# Patient Record
Sex: Female | Born: 1985 | Race: White | Hispanic: No | Marital: Married | Smoking: Never smoker
Health system: Southern US, Community
[De-identification: ages and names within clinical notes are randomized; demographics above are authoritative.]

## PROBLEM LIST (undated history)

## (undated) DIAGNOSIS — R110 Nausea: Secondary | ICD-10-CM

## (undated) DIAGNOSIS — R197 Diarrhea, unspecified: Secondary | ICD-10-CM

## (undated) DIAGNOSIS — M1990 Unspecified osteoarthritis, unspecified site: Secondary | ICD-10-CM

## (undated) DIAGNOSIS — Z973 Presence of spectacles and contact lenses: Secondary | ICD-10-CM

## (undated) DIAGNOSIS — R5383 Other fatigue: Secondary | ICD-10-CM

## (undated) DIAGNOSIS — G43909 Migraine, unspecified, not intractable, without status migrainosus: Secondary | ICD-10-CM

## (undated) DIAGNOSIS — K8020 Calculus of gallbladder without cholecystitis without obstruction: Secondary | ICD-10-CM

## (undated) DIAGNOSIS — K219 Gastro-esophageal reflux disease without esophagitis: Secondary | ICD-10-CM

## (undated) DIAGNOSIS — R634 Abnormal weight loss: Secondary | ICD-10-CM

## (undated) DIAGNOSIS — R630 Anorexia: Secondary | ICD-10-CM

## (undated) DIAGNOSIS — B019 Varicella without complication: Secondary | ICD-10-CM

## (undated) DIAGNOSIS — J45909 Unspecified asthma, uncomplicated: Secondary | ICD-10-CM

## (undated) HISTORY — DX: Unspecified asthma, uncomplicated: J45.909

## (undated) HISTORY — DX: Varicella without complication: B01.9

## (undated) HISTORY — DX: Diarrhea, unspecified: R19.7

## (undated) HISTORY — DX: Migraine, unspecified, not intractable, without status migrainosus: G43.909

## (undated) HISTORY — DX: Abnormal weight loss: R63.4

## (undated) HISTORY — DX: Gastro-esophageal reflux disease without esophagitis: K21.9

## (undated) HISTORY — DX: Nausea: R11.0

## (undated) HISTORY — DX: Other fatigue: R53.83

## (undated) HISTORY — DX: Unspecified osteoarthritis, unspecified site: M19.90

## (undated) HISTORY — DX: Presence of spectacles and contact lenses: Z97.3

## (undated) HISTORY — PX: WISDOM TOOTH EXTRACTION: SHX21

## (undated) HISTORY — DX: Anorexia: R63.0

## (undated) HISTORY — PX: CHOLECYSTECTOMY: SHX55

---
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
## 2005-05-05 ENCOUNTER — Emergency Department (HOSPITAL_COMMUNITY): Admission: EM | Admit: 2005-05-05 | Discharge: 2005-05-05 | Payer: Self-pay | Admitting: Emergency Medicine

## 2011-06-23 ENCOUNTER — Encounter (INDEPENDENT_AMBULATORY_CARE_PROVIDER_SITE_OTHER): Payer: Self-pay

## 2011-06-24 ENCOUNTER — Encounter (INDEPENDENT_AMBULATORY_CARE_PROVIDER_SITE_OTHER): Payer: Self-pay | Admitting: General Surgery

## 2011-06-24 ENCOUNTER — Ambulatory Visit (INDEPENDENT_AMBULATORY_CARE_PROVIDER_SITE_OTHER): Payer: BC Managed Care – PPO | Admitting: General Surgery

## 2011-06-24 VITALS — BP 110/74 | HR 83 | Temp 98.5°F | Ht 66.0 in | Wt 139.6 lb

## 2011-06-24 DIAGNOSIS — K8020 Calculus of gallbladder without cholecystitis without obstruction: Secondary | ICD-10-CM

## 2011-06-24 HISTORY — DX: Calculus of gallbladder without cholecystitis without obstruction: K80.20

## 2011-06-24 NOTE — Progress Notes (Signed)
Chief Complaint  Patient presents with  . Other    eval gallstones    HPI [name] is a 25 y.o. female.   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 More recently within the past month the patient does have more severe right upper quadrant pain associated with nausea no vomiting no fevers no chills and no jaundice.  She has been very careful about what she eats however regardless of what she does eat she tends to have the pain after eating. The pain usually comes at nighttime.  Past Medical History  Diagnosis Date  . Abdominal pain   . Diarrhea   . Weight loss   . Nausea   . Weight loss   . Fatigue   . Nausea   . Poor appetite   . Abdominal pain   . Wears glasses     History reviewed. No pertinent past surgical history.  Family History  Problem Relation Age of Onset  . Cervical cancer Mother   . Multiple sclerosis Mother     Social History History  Substance Use Topics  . Smoking status: Never Smoker   . Smokeless tobacco: Not on file  . Alcohol Use: Yes     social    No Known Allergies  Current Outpatient Prescriptions  Medication Sig Dispense Refill  . Norgestimate-Ethinyl Estradiol Triphasic (ORTHO TRI-CYCLEN LO) 0.18/0.215/0.25 MG-25 MCG tablet Take 1 tablet by mouth daily.        . traMADol (ULTRAM) 50 MG tablet Take 50 mg by mouth as needed.          Review of Systems Review of Systems  Constitutional: Positive for weight loss. Negative for fever and chills.  Eyes: Negative.   Respiratory: Negative.   Cardiovascular: Negative.   Gastrointestinal: Positive for heartburn (has taken protonix in past), nausea, abdominal pain and diarrhea. Negative for constipation and blood in stool.  Neurological: Negative.   Endo/Heme/Allergies: Negative.   Psychiatric/Behavioral: Negative.     Blood pressure 110/74, pulse  83, temperature 98.5 F (36.9 C), height 5\' 6"  (1.676 m), weight 139 lb 9.6 oz (63.322 kg).  Physical Exam Physical Exam  Constitutional: She is oriented to person, place, and time. She appears well-developed and well-nourished.  HENT:  Head: Normocephalic and atraumatic.  Eyes: EOM are normal. Pupils are equal, round, and reactive to light.  Neck: Normal range of motion. Neck supple.  Cardiovascular: Normal rate, regular rhythm and normal heart sounds.  Exam reveals no gallop.   No murmur heard. Respiratory: Effort normal and breath sounds normal.  GI: Soft. She exhibits no mass. There is tenderness (RUG tenderness>epigastric tenderness). There is no rebound and no guarding.  Musculoskeletal: Normal range of motion.  Neurological: She is alert and oriented to person, place, and time.  Skin: Skin is warm and dry.  Psychiatric: She has a normal mood and affect. Her behavior is normal. Judgment and thought content normal.     Data Reviewed US shows gallstones without evidence of cholecystitis.  Normal LFTs Assessment    Symptomatic cholelithiasis .    Plan    The risk and benefits of surgical treatment have been explained to the patient along with her the options. The patient and her mom understand and wish to proceed with surgery soon as possible. We will schedule her for laparoscopic cholecystectomy with possible cholangiogram and soon as possible.  Edmond Ginsberg III,Lizandra Zakrzewski O 06/24/2011, 9:49 AM

## 2011-06-26 ENCOUNTER — Encounter (HOSPITAL_COMMUNITY)
Admission: RE | Admit: 2011-06-26 | Discharge: 2011-06-26 | Disposition: A | Discharge status: Home / Self Care | Payer: BC Managed Care – PPO | Source: Ambulatory Visit | Attending: General Surgery | Admitting: General Surgery

## 2011-06-26 LAB — COMPREHENSIVE METABOLIC PANEL
AST: 17 U/L (ref 0–37)
Albumin: 3.8 g/dL (ref 3.5–5.2)
BUN: 11 mg/dL (ref 6–23)
CO2: 26 mEq/L (ref 19–32)
Calcium: 10.1 mg/dL (ref 8.4–10.5)
Creatinine, Ser: 0.71 mg/dL (ref 0.50–1.10)
GFR calc non Af Amer: >60 mL/min (ref >60)

## 2011-06-26 LAB — DIFFERENTIAL
Eosinophils Absolute: 0.1 10*3/uL (ref 0.0–0.7)
Eosinophils Relative: 1 % (ref 0–5)
Lymphs Abs: 3.2 10*3/uL (ref 0.7–4.0)
Monocytes Relative: 5 % (ref 3–12)

## 2011-06-26 LAB — SURGICAL PCR SCREEN: Staphylococcus aureus: NEGATIVE

## 2011-06-26 LAB — CBC
HCT: 35.9 % — ABNORMAL LOW (ref 36.0–46.0)
MCH: 27.3 pg (ref 26.0–34.0)
MCV: 79.6 fL (ref 78.0–100.0)
Platelets: 398 10*3/uL (ref 150–400)
RDW: 12.5 % (ref 11.5–15.5)

## 2011-06-26 LAB — HCG, SERUM, QUALITATIVE: Preg, Serum: NEGATIVE

## 2011-06-28 HISTORY — PX: LAPAROSCOPIC CHOLECYSTECTOMY: SUR755

## 2011-06-30 ENCOUNTER — Ambulatory Visit (HOSPITAL_COMMUNITY)
Admission: RE | Admit: 2011-06-30 | Discharge: 2011-06-30 | Disposition: A | Discharge status: Home / Self Care | Payer: BC Managed Care – PPO | Source: Ambulatory Visit | Attending: General Surgery | Admitting: General Surgery

## 2011-06-30 ENCOUNTER — Other Ambulatory Visit (INDEPENDENT_AMBULATORY_CARE_PROVIDER_SITE_OTHER): Payer: Self-pay | Admitting: General Surgery

## 2011-06-30 DIAGNOSIS — K8010 Calculus of gallbladder with chronic cholecystitis without obstruction: Secondary | ICD-10-CM | POA: Insufficient documentation

## 2011-06-30 DIAGNOSIS — F411 Generalized anxiety disorder: Secondary | ICD-10-CM | POA: Insufficient documentation

## 2011-06-30 DIAGNOSIS — Z01812 Encounter for preprocedural laboratory examination: Secondary | ICD-10-CM | POA: Insufficient documentation

## 2011-07-14 NOTE — Op Note (Signed)
[name], [name] NO.:  [id number]  MEDICAL RECORD NO.:  [id number]  LOCATION:  [facility]                         FACILITY:  [facility]  PHYSICIAN:  [name], M.D.    DATE OF BIRTH:  12/28/1985  DATE OF PROCEDURE:  06/30/2011 DATE OF DISCHARGE:                              OPERATIVE REPORT   PREOPERATIVE DIAGNOSIS:  Symptomatic cholelithiasis.  POSTOPERATIVE DIAGNOSIS:  Symptomatic cholelithiasis.  PROCEDURE:  Laparoscopic cholecystectomy.  SURGEON:  Emily Lamas. Lindie Spruce, MD  ANESTHESIA:  General endotracheal.  ESTIMATED BLOOD LOSS:  Less than 20 mL.  No complications.  CONDITION:  Stable.  FINDINGS:  The patient had transposed cystic duct and cystic artery with the cystic artery being on the outer border of the triangle of Calot and the cystic duct on the inner part between the artery and the common bile duct.  INDICATIONS FOR OPERATION:  The patient is a 25 year old with symptomatic gallstones who now comes in for an elective laparoscopic cholecystectomy.  OPERATION:  After proper time-out was performed identifying the patient and procedure to be performed, the patient was prepped and draped in usual sterile manner exposing the entire abdomen.  An infraumbilical midline incision was made using #15 blade, taken down to the midline fascia.  We grabbed the fascia with Kocher clamps and tented up.  We incised the fascia using a 15 blade and then bluntly dissected out through the preperitoneal space into the peritoneal cavity.  Once this was done, pursestring suture of 0 Vicryl was passed around the fascial opening.  This secured in Hasson cannula which was subsequently passed.  It was through the [facility] cannula that carbon dioxide gas was insufflated up to a maximal intra-abdominal pressure of 15 mmHg.  Once that was performed, we passed 2 right upper quadrant 5-mm cannulas and a subxiphoid 5-mm cannula under direct vision.  The patient was placed in  reverse Trendelenburg, the left side was tilted down.  The gallbladder had some omental adhesions to the body, which were taken down as we retracted towards the anterior abdominal wall.  We were able to dissect out the peritoneum overlying the triangle of Calot and hepatoduodenal triangle.  As we noted there was an arterial structure, we came to the lower portion of the infundibulum simulating cystic duct. The patient had normal preoperative LFTs.  No plans for cholangiogram was being made.  We clipped this structure proximally and distally after we had a 360-degree window dissected freely.  We clipped it proximally and distally and transected it.  We then got to the duct which was on the inner part of the triangle, dissected it out proximally and distally, placed clips on it proximally x2 and then distally x1 and transected it.  We then dissected out the gallbladder from its bed with minimal difficulty without entrance into the gallbladder.  We used an EndoCatch bag to retrieve it from the infraumbilical site.  We tied off the fascia using a pursestring suture which was in place, irrigated the peritoneal cavity and the hepatic bed with saline and there was minimal to no bleeding.  Once this was done, we aspirated all fluid and gas from above the liver removed all  cannulas.  We injected 0.25% Marcaine with epi at all sites.  We used a running subcuticular 4-0 Monocryl to sew the infraumbilical skin site. Dermabond, Steri-Strips, and Tegaderm were used complete the dressing. All needle, sponge counts and instrument counts were correct.     [name], M.D.     JOW/MEDQ  D:  06/30/2011  T:  06/30/2011  Job:  [id number]  cc:   Emily Gemma, PA  Electronically Signed by [name] M.D. on 07/14/2011 08:50:57 AM

## 2011-07-22 ENCOUNTER — Encounter (INDEPENDENT_AMBULATORY_CARE_PROVIDER_SITE_OTHER): Payer: Self-pay | Admitting: General Surgery

## 2011-07-22 ENCOUNTER — Ambulatory Visit (INDEPENDENT_AMBULATORY_CARE_PROVIDER_SITE_OTHER): Payer: BC Managed Care – PPO | Admitting: General Surgery

## 2011-07-22 VITALS — BP 102/74 | HR 60 | Temp 97.2°F | Resp 14 | Ht 65.0 in | Wt 134.1 lb

## 2011-07-22 DIAGNOSIS — Z09 Encounter for follow-up examination after completed treatment for conditions other than malignant neoplasm: Secondary | ICD-10-CM

## 2011-07-22 NOTE — Progress Notes (Signed)
HPI The patient is doing well status post laparoscopic cholecystectomy for acute cholecystitis and cholelithiasis.  PE Her wounds are healing well with no evidence of infection. She has good bowel sounds. She is nontender.  Studiy review No studies to review.  Assessment Doing well status post laparoscopic cholecystectomy.  Plan Return to see me on a p.r.n. basis. She's been released to full duty.

## 2012-03-10 ENCOUNTER — Other Ambulatory Visit: Payer: Self-pay | Admitting: Obstetrics and Gynecology

## 2012-09-20 ENCOUNTER — Other Ambulatory Visit: Payer: Self-pay | Admitting: Obstetrics and Gynecology

## 2013-09-06 DIAGNOSIS — IMO0002 Reserved for concepts with insufficient information to code with codable children: Secondary | ICD-10-CM | POA: Insufficient documentation

## 2017-11-16 LAB — HM PAP SMEAR: HM Pap smear: NEGATIVE

## 2018-09-18 ENCOUNTER — Emergency Department (HOSPITAL_COMMUNITY)
Admission: EM | Admit: 2018-09-18 | Discharge: 2018-09-18 | Disposition: A | Discharge status: Home / Self Care | Payer: Self-pay | Attending: Emergency Medicine | Admitting: Emergency Medicine

## 2018-09-18 ENCOUNTER — Encounter (HOSPITAL_COMMUNITY): Payer: Self-pay

## 2018-09-18 ENCOUNTER — Emergency Department (HOSPITAL_COMMUNITY): Payer: Self-pay

## 2018-09-18 ENCOUNTER — Other Ambulatory Visit: Payer: Self-pay

## 2018-09-18 DIAGNOSIS — M7918 Myalgia, other site: Secondary | ICD-10-CM

## 2018-09-18 DIAGNOSIS — M791 Myalgia, unspecified site: Secondary | ICD-10-CM | POA: Insufficient documentation

## 2018-09-18 MED ORDER — ACETAMINOPHEN 500 MG PO TABS
1000.0000 mg | ORAL_TABLET | Freq: Once | ORAL | Status: AC
  Administered 2018-09-18: 1000 mg via ORAL
  Filled 2018-09-18: qty 2
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
## 2018-09-18 NOTE — ED Notes (Signed)
Patient transported to X-ray 
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
## 2018-09-18 NOTE — ED Notes (Signed)
Patient verbalizes understanding of discharge instructions. Opportunity for questioning and answers were provided. Armband removed by staff, pt discharged from ED.  

## 2019-05-26 ENCOUNTER — Encounter (INDEPENDENT_AMBULATORY_CARE_PROVIDER_SITE_OTHER): Payer: Self-pay | Admitting: General Surgery

## 2019-06-27 ENCOUNTER — Ambulatory Visit (INDEPENDENT_AMBULATORY_CARE_PROVIDER_SITE_OTHER): Payer: 59 | Admitting: Primary Care

## 2019-06-27 ENCOUNTER — Ambulatory Visit: Payer: 59

## 2019-06-27 ENCOUNTER — Other Ambulatory Visit: Payer: Self-pay

## 2019-06-27 ENCOUNTER — Encounter: Payer: Self-pay | Admitting: Primary Care

## 2019-06-27 DIAGNOSIS — G43709 Chronic migraine without aura, not intractable, without status migrainosus: Secondary | ICD-10-CM | POA: Diagnosis not present

## 2019-06-27 DIAGNOSIS — G43909 Migraine, unspecified, not intractable, without status migrainosus: Secondary | ICD-10-CM | POA: Insufficient documentation

## 2019-06-27 NOTE — Patient Instructions (Signed)
It was a pleasure to meet you today! Please don't hesitate to call or message me with any questions. Welcome to [place]!   

## 2019-06-27 NOTE — Assessment & Plan Note (Signed)
Chronic, infrequent overall. Continue to monitor.

## 2019-06-27 NOTE — Progress Notes (Signed)
Subjective:    Patient ID: [name], female    DOB: 06-26-1986, 33 y.o.   MRN: [id number]  HPI  Virtual Visit via Video Note  I connected with Lauris Poag on 06/27/19 at 11:00 AM EDT by a video enabled telemedicine application and verified that I am speaking with the correct person using two identifiers.  Location: Patient: Home Provider: Office   I discussed the limitations of evaluation and management by telemedicine and the availability of in person appointments. The patient expressed understanding and agreed to proceed.  History of Present Illness:  Ms. [name] is a 33 year old female who presents today to establish care and discuss the problems mentioned below. Will obtain/review records.  1) Migraines: Intermittent and occurring several times annually with menstrual cycles. No recent migraines.   2) Birth Control: Managed on OCP's per GYN. Follows with [facility]on Gully through [facility]. She did have labs done recently per GYN, she will find a copy for [place] to place on file. Endorses her cholesterol was slightly elevated but not enough for medication treatment.   Observations/Objective:  Alert and oriented. Appears well, not sickly. No distress. Speaking in complete sentences.   Assessment and Plan:  See problem based charting.  Follow Up Instructions:  It was a pleasure to meet you today! Please don't hesitate to call or message me with any questions. Welcome to [facility]!   I discussed the assessment and treatment plan with the patient. The patient was provided an opportunity to ask questions and all were answered. The patient agreed with the plan and demonstrated an understanding of the instructions.   The patient was advised to call back or seek an in-person evaluation if the symptoms worsen or if the condition fails to improve as anticipated   Pleas Koch, NP ;   Review of Systems  Respiratory: Negative for shortness of breath.    Cardiovascular: Negative for chest pain.  Genitourinary: Negative for menstrual problem.  Neurological: Negative for dizziness and headaches.       Past Medical History:  Diagnosis Date  . Abdominal pain   . Chickenpox   . Diarrhea   . Fatigue   . Migraines   . Nausea   . Poor appetite   . Wears glasses   . Weight loss      Social History   Socioeconomic History  . Marital status: Married    Spouse name: Not on file  . Number of children: Not on file  . Years of education: Not on file  . Highest education level: Not on file  Occupational History  . Not on file  Social Needs  . Financial resource strain: Not on file  . Food insecurity    Worry: Not on file    Inability: Not on file  . Transportation needs    Medical: Not on file    Non-medical: Not on file  Tobacco Use  . Smoking status: Never Smoker  . Smokeless tobacco: Never Used  Substance and Sexual Activity  . Alcohol use: Yes    Comment: socially  . Drug use: Not Currently  . Sexual activity: Not on file  Lifestyle  . Physical activity    Days per week: Not on file    Minutes per session: Not on file  . Stress: Not on file  Relationships  . Social Herbalist on phone: Not on file    Gets together: Not on file    Attends religious service:  Not on file    Active member of club or organization: Not on file    Attends meetings of clubs or organizations: Not on file    Relationship status: Not on file  . Intimate partner violence    Fear of current or ex partner: Not on file    Emotionally abused: Not on file    Physically abused: Not on file    Forced sexual activity: Not on file  Other Topics Concern  . Not on file  Social History Narrative   ** Merged History Encounter **        Past Surgical History:  Procedure Laterality Date  . CHOLECYSTECTOMY    . LAPAROSCOPIC CHOLECYSTECTOMY  06/28/11  . WISDOM TOOTH EXTRACTION      Family History  Problem Relation Age of Onset  .  Cervical cancer Mother   . Multiple sclerosis Mother   . Arthritis Maternal Grandmother   . Hyperlipidemia Maternal Grandmother   . Cancer Maternal Grandfather   . Cancer Paternal Grandfather     No Known Allergies  Current Outpatient Medications on File Prior to Visit  Medication Sig Dispense Refill  . Norgestimate-Ethinyl Estradiol Triphasic (ORTHO TRI-CYCLEN LO) 0.18/0.215/0.25 MG-25 MCG tablet Take 1 tablet by mouth daily.       No current facility-administered medications on file prior to visit.     LMP 06/05/2019    Objective:   Physical Exam  Constitutional: She is oriented to person, place, and time. She appears well-nourished.  Respiratory: Effort normal.  Neurological: She is alert and oriented to person, place, and time.  Psychiatric: She has a normal mood and affect.           Assessment & Plan:

## 2019-07-16 ENCOUNTER — Encounter: Payer: Self-pay | Admitting: Primary Care

## 2020-01-27 ENCOUNTER — Ambulatory Visit: Payer: BC Managed Care – PPO | Attending: Internal Medicine

## 2020-01-27 DIAGNOSIS — Z23 Encounter for immunization: Secondary | ICD-10-CM

## 2020-01-27 NOTE — Progress Notes (Signed)
   Covid-19 Vaccination Clinic  Name:  [name]    MRN: [id number] DOB: [birth year]  01/27/2020  [name] was observed post Covid-19 immunization for 15 minutes without incident. She was provided with Vaccine Information Sheet and instruction to access the V-Safe system.   [name] was instructed to call 911 with any severe reactions post vaccine: Marland Kitchen Difficulty breathing  . Swelling of face and throat  . A fast heartbeat  . A bad rash all over body  . Dizziness and weakness   Immunizations Administered    Name Date Dose VIS Date Route   Pfizer COVID-19 Vaccine 01/27/2020  1:10 PM 0.3 mL 10/14/2019 Intramuscular   Manufacturer: [employer], [employer]   Lot: [id number]   NDC: [address]-3

## 2020-02-02 ENCOUNTER — Ambulatory Visit (INDEPENDENT_AMBULATORY_CARE_PROVIDER_SITE_OTHER): Payer: BC Managed Care – PPO | Admitting: Primary Care

## 2020-02-02 ENCOUNTER — Encounter: Payer: Self-pay | Admitting: Primary Care

## 2020-02-02 VITALS — Wt 140.0 lb

## 2020-02-02 DIAGNOSIS — J302 Other seasonal allergic rhinitis: Secondary | ICD-10-CM | POA: Diagnosis not present

## 2020-02-02 DIAGNOSIS — R059 Cough, unspecified: Secondary | ICD-10-CM

## 2020-02-02 DIAGNOSIS — R05 Cough: Secondary | ICD-10-CM

## 2020-02-02 MED ORDER — GUAIFENESIN-CODEINE 100-10 MG/5ML PO SYRP: 5.0000 mL | ORAL_SOLUTION | Freq: Three times a day (TID) | ORAL | 0 refills | Status: DC | PRN

## 2020-02-02 NOTE — Assessment & Plan Note (Signed)
Symptoms x 1 week. Do agree with allergy involvement being part of her symptoms. Question whether she had Covid-19 prior to her injection, but she's feeling well and doesn't appear sickly. For now will continue with allergy treatment. Add Claritin. Continue Flonase, Delsym during the day.

## 2020-02-02 NOTE — Progress Notes (Signed)
Subjective:    Patient ID: [name], female    DOB: [birth year], 34 y.o.   MRN: [id number]  HPI  Virtual Visit via Video Note  I connected with [name] on 02/02/20 at 11:00 AM EDT by a video enabled telemedicine application and verified that I am speaking with the correct person using two identifiers.  Location: Patient: Work Provider: Office   I discussed the limitations of evaluation and management by telemedicine and the availability of in person appointments. The patient expressed understanding and agreed to proceed.  History of Present Illness:  [name] is a 34 year old female with a history of migraines who presents today with a chief complaint of cough.  She also reports head congestion, headache, nasal drainage. She's using Flonase, Mucinex, Delsym, Tessalon Perles without improvement. Her cough is worse at night, productive during the day.   She denies fevers, chills, body aches, diarrhea, loss of taste/smell, exposure to Covid-19. Symptoms began about one week ago, feels like her typical allergy symptoms. She had her first Covid-19 vaccine after her cough began. She's not feeling bad, just frustrated with the cough.    Observations/Objective:  Alert and oriented. Appears well, not sickly. No distress. Speaking in complete sentences. Mildly congested cough during visit.  Assessment and Plan:  Symptoms x 1 week. Do agree with allergy involvement being part of her symptoms. Question whether she had Covid-19 prior to her injection, but she's feeling well and doesn't appear sickly. For now will continue with allergy treatment. Add Claritin. Continue Flonase, Delsym during the day.  Rx for Cheratussin to use TID PRN, drowsiness precautions provided. She will update if no improvement by early next week.  Follow Up Instructions:  You may take the cough suppressant every 8 hours as needed for cough and rest. Caution this medication contains codeine which may  cause drowsiness.   Resume Claritin. Continue Flonase nasal spray.  Please update me early next week if your symptoms progress.  It was a pleasure to see you today! [name]    I discussed the assessment and treatment plan with the patient. The patient was provided an opportunity to ask questions and all were answered. The patient agreed with the plan and demonstrated an understanding of the instructions.   The patient was advised to call back or seek an in-person evaluation if the symptoms worsen or if the condition fails to improve as anticipated.    Emily Koch, NP    Review of Systems  Constitutional: Negative for chills, fatigue and fever.  HENT: Positive for congestion, postnasal drip and rhinorrhea. Negative for sore throat.   Respiratory: Positive for cough. Negative for shortness of breath.   Allergic/Immunologic: Positive for environmental allergies.       Past Medical History:  Diagnosis Date  . Abdominal pain   . Chickenpox   . Diarrhea   . Fatigue   . Migraines   . Nausea   . Poor appetite   . Symptomatic cholelithiasis 06/24/2011  . Wears glasses   . Weight loss      Social History   Socioeconomic History  . Marital status: Married    Spouse name: Not on file  . Number of children: Not on file  . Years of education: Not on file  . Highest education level: Not on file  Occupational History  . Not on file  Tobacco Use  . Smoking status: Never Smoker  . Smokeless tobacco: Never Used  Substance and Sexual  Activity  . Alcohol use: Yes    Comment: socially  . Drug use: Not Currently  . Sexual activity: Not on file  Other Topics Concern  . Not on file  Social History Narrative   Married.   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       Social Determinants of Health   Financial Resource Strain:   . Difficulty of Paying Living Expenses:   Food Insecurity:   . Worried About Programme researcher, broadcasting/film/video in the Last Year:   . Barista  in the Last Year:   Transportation Needs:   . Freight forwarder (Medical):   Marland Kitchen Lack of Transportation (Non-Medical):   Physical Activity:   . Days of Exercise per Week:   . Minutes of Exercise per Session:   Stress:   . Feeling of Stress :   Social Connections:   . Frequency of Communication with Friends and Family:   . Frequency of Social Gatherings with Friends and Family:   . Attends Religious Services:   . Active Member of Clubs or Organizations:   . Attends Banker Meetings:   Marland Kitchen Marital Status:   Intimate Partner Violence:   . Fear of Current or Ex-Partner:   . Emotionally Abused:   Marland Kitchen Physically Abused:   . Sexually Abused:     Past Surgical History:  Procedure Laterality Date  . CHOLECYSTECTOMY    . LAPAROSCOPIC CHOLECYSTECTOMY  06/28/11  . WISDOM TOOTH EXTRACTION      Family History  Problem Relation Age of Onset  . Cervical cancer Mother   . Multiple sclerosis Mother   . Arthritis Maternal Grandmother   . Hyperlipidemia Maternal Grandmother   . Stomach cancer Maternal Grandfather   . Lung cancer Paternal Grandfather     No Known Allergies  Current Outpatient Medications on File Prior to Visit  Medication Sig Dispense Refill  . Norgestimate-Ethinyl Estradiol Triphasic (ORTHO TRI-CYCLEN LO) 0.18/0.215/0.25 MG-25 MCG tablet Take 1 tablet by mouth daily.       No current facility-administered medications on file prior to visit.    Wt 140 lb (63.5 kg)   BMI 22.60 kg/m    Objective:   Physical Exam  Constitutional: She is oriented to person, place, and time. She appears well-nourished.  Respiratory: Effort normal.  Mild congested cough noted during exam  Neurological: She is alert and oriented to person, place, and time.           Assessment & Plan:

## 2020-02-02 NOTE — Patient Instructions (Signed)
You may take the cough suppressant every 8 hours as needed for cough and rest. Caution this medication contains codeine which may cause drowsiness.   Resume Claritin. Continue Flonase nasal spray.  Please update me early next week if your symptoms progress.  It was a pleasure to see you today! Mayra Reel, NP-C

## 2020-02-06 DIAGNOSIS — Z13220 Encounter for screening for lipoid disorders: Secondary | ICD-10-CM | POA: Diagnosis not present

## 2020-02-06 DIAGNOSIS — Z130 Encounter for screening for diseases of the blood and blood-forming organs and certain disorders involving the immune mechanism: Secondary | ICD-10-CM | POA: Diagnosis not present

## 2020-02-06 DIAGNOSIS — Z0000 Encounter for general adult medical examination without abnormal findings: Secondary | ICD-10-CM | POA: Diagnosis not present

## 2020-02-06 DIAGNOSIS — Z1329 Encounter for screening for other suspected endocrine disorder: Secondary | ICD-10-CM | POA: Diagnosis not present

## 2020-02-06 DIAGNOSIS — Z6823 Body mass index (BMI) 23.0-23.9, adult: Secondary | ICD-10-CM | POA: Diagnosis not present

## 2020-02-06 DIAGNOSIS — Z01419 Encounter for gynecological examination (general) (routine) without abnormal findings: Secondary | ICD-10-CM | POA: Diagnosis not present

## 2020-02-07 DIAGNOSIS — M545 Low back pain: Secondary | ICD-10-CM | POA: Diagnosis not present

## 2020-02-21 ENCOUNTER — Ambulatory Visit: Payer: BC Managed Care – PPO | Attending: Internal Medicine

## 2020-02-21 DIAGNOSIS — Z23 Encounter for immunization: Secondary | ICD-10-CM

## 2020-02-21 NOTE — Progress Notes (Signed)
   Covid-19 Vaccination Clinic  Name:  [name]    MRN: [id number] DOB: [birth year]  02/21/2020  [name] was observed post Covid-19 immunization for 15 minutes without incident. She was provided with Vaccine Information Sheet and instruction to access the V-Safe system.   [name] was instructed to call 911 with any severe reactions post vaccine: Marland Kitchen Difficulty breathing  . Swelling of face and throat  . A fast heartbeat  . A bad rash all over body  . Dizziness and weakness   Immunizations Administered    Name Date Dose VIS Date Route   Pfizer COVID-19 Vaccine 02/21/2020  4:41 PM 0.3 mL 12/28/2018 Intramuscular   Manufacturer: [employer], [employer]   Lot: [id number]   NDC: [address]-8

## 2020-03-13 ENCOUNTER — Encounter: Payer: Self-pay | Admitting: Primary Care

## 2020-03-13 ENCOUNTER — Other Ambulatory Visit: Payer: Self-pay

## 2020-03-13 ENCOUNTER — Ambulatory Visit: Payer: BC Managed Care – PPO | Admitting: Primary Care

## 2020-03-13 VITALS — BP 114/76 | HR 88 | Temp 96.9°F | Ht 66.0 in | Wt 142.0 lb

## 2020-03-13 DIAGNOSIS — J302 Other seasonal allergic rhinitis: Secondary | ICD-10-CM | POA: Diagnosis not present

## 2020-03-13 MED ORDER — CETIRIZINE HCL 10 MG PO TABS: 10.0000 mg | ORAL_TABLET | Freq: Every day | ORAL | 0 refills | Status: DC

## 2020-03-13 MED ORDER — MONTELUKAST SODIUM 10 MG PO TABS: 10.0000 mg | ORAL_TABLET | Freq: Every day | ORAL | 0 refills | Status: DC

## 2020-03-13 NOTE — Patient Instructions (Signed)
Start cetirizine (Zyrtec) 10 mg once daily for allergies.  Start montelukast (Singulair) 10 mg once nightly for allergies.  Please update me in 2 weeks.  It was a pleasure to see you today!

## 2020-03-13 NOTE — Progress Notes (Signed)
Subjective:    Patient ID: [name], female    DOB: Oct 28, 1986, 34 y.o.   MRN: [id number]  HPI  This visit occurred during the SARS-CoV-2 public health emergency.  Safety protocols were in place, including screening questions prior to the visit, additional usage of staff PPE, and extensive cleaning of exam room while observing appropriate contact time as indicated for disinfecting solutions.   [name] is a 34 year old female with a history of seasonal allergies, migraines who presents today with a chief complaint of cough.  She was last evaluated on 02/02/20 for complaints of cough, head congestion, nasal drainage, headache. She had tried numerous OTC products without improvement. We decided to add in Claritin to her regimen, continue Flonase/Delsym. She requested codeine cough syrup to use if needed, this was provided.  Since her last visit she's continued to notice throat and chest congestion, constant tickle to her throat, dry cough at night, airway tightness at times. Overall her symptoms have improved slightly since we met in April 2021, but have not resolved.  She's been taking Zyrtec-D over the last week due to congestion and rhiorrhea, not currently taking anything OTC.  She's tried Flonase, Claritin in the past.   She denies a history of asthma.   She denies esophageal burning, belching, reflux regularly, but has noticed a few symptoms of "indigestion" at times.   Review of Systems  Constitutional: Negative for fever.  HENT: Positive for congestion and postnasal drip.   Allergic/Immunologic: Positive for environmental allergies.       Past Medical History:  Diagnosis Date  . Abdominal pain   . Chickenpox   . Diarrhea   . Fatigue   . Migraines   . Nausea   . Poor appetite   . Symptomatic cholelithiasis 06/24/2011  . Wears glasses   . Weight loss      Social History   Socioeconomic History  . Marital status: Married    Spouse name: Not on file  . Number of  children: Not on file  . Years of education: Not on file  . Highest education level: Not on file  Occupational History  . Not on file  Tobacco Use  . Smoking status: Never Smoker  . Smokeless tobacco: Never Used  Substance and Sexual Activity  . Alcohol use: Yes    Comment: socially  . Drug use: Not Currently  . Sexual activity: Not on file  Other Topics Concern  . Not on file  Social History Narrative   Married.   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.       Social Determinants of Health   Financial Resource Strain:   . Difficulty of Paying Living Expenses:   Food Insecurity:   . Worried About Charity fundraiser in the Last Year:   . Arboriculturist in the Last Year:   Transportation Needs:   . Film/video editor (Medical):   Marland Kitchen Lack of Transportation (Non-Medical):   Physical Activity:   . Days of Exercise per Week:   . Minutes of Exercise per Session:   Stress:   . Feeling of Stress :   Social Connections:   . Frequency of Communication with Friends and Family:   . Frequency of Social Gatherings with Friends and Family:   . Attends Religious Services:   . Active Member of Clubs or Organizations:   . Attends Archivist Meetings:   Marland Kitchen Marital Status:   Intimate Partner Violence:   .  Fear of Current or Ex-Partner:   . Emotionally Abused:   Marland Kitchen Physically Abused:   . Sexually Abused:     Past Surgical History:  Procedure Laterality Date  . CHOLECYSTECTOMY    . LAPAROSCOPIC CHOLECYSTECTOMY  06/28/11  . WISDOM TOOTH EXTRACTION      Family History  Problem Relation Age of Onset  . Cervical cancer Mother   . Multiple sclerosis Mother   . Arthritis Maternal Grandmother   . Hyperlipidemia Maternal Grandmother   . Stomach cancer Maternal Grandfather   . Lung cancer Paternal Grandfather     Allergies  Allergen Reactions  . Amoxicillin-Pot Clavulanate     Current Outpatient Medications on File Prior to Visit  Medication Sig Dispense Refill  .  Norgestimate-Ethinyl Estradiol Triphasic (ORTHO TRI-CYCLEN LO) 0.18/0.215/0.25 MG-25 MCG tablet Take 1 tablet by mouth daily.       No current facility-administered medications on file prior to visit.    BP 114/76   Pulse 88   Temp (!) 96.9 F (36.1 C) (Temporal)   Ht _0  (1.676 m)   Wt 142 lb (64.4 kg)   LMP 02/12/2020   SpO2 92%   BMI 22.92 kg/m    Objective:   Physical Exam  Constitutional: She appears well-nourished.  Cardiovascular: Normal rate and regular rhythm.  Respiratory: Effort normal and breath sounds normal.  Musculoskeletal:     Cervical back: Neck supple.  Skin: Skin is warm and dry.  Psychiatric: She has a normal mood and affect.           Assessment & Plan:

## 2020-03-13 NOTE — Assessment & Plan Note (Addendum)
Uncontrolled, mostly with post nasal drip, mucous development, and cough.  Start Zyrtec and Singulair, prescriptions sent to pharmacy. Consider adding in famotidine daily if no improvement as allergy symptoms may have aggravated GERD.  Discussed to avoid use of Zyrtec-D consistently, max daily dosing is 5-7 days. She verbalized understanding.  She will update.

## 2020-03-15 DIAGNOSIS — R0789 Other chest pain: Secondary | ICD-10-CM

## 2020-03-15 DIAGNOSIS — M62838 Other muscle spasm: Secondary | ICD-10-CM

## 2020-03-16 MED ORDER — NAPROXEN 500 MG PO TABS: 500.0000 mg | ORAL_TABLET | Freq: Two times a day (BID) | ORAL | 0 refills | Status: DC

## 2020-03-16 MED ORDER — CYCLOBENZAPRINE HCL 5 MG PO TABS: 5.0000 mg | ORAL_TABLET | Freq: Every evening | ORAL | 0 refills | Status: DC | PRN

## 2020-05-01 ENCOUNTER — Other Ambulatory Visit: Payer: Self-pay | Admitting: Primary Care

## 2020-05-01 DIAGNOSIS — J302 Other seasonal allergic rhinitis: Secondary | ICD-10-CM

## 2020-05-13 ENCOUNTER — Other Ambulatory Visit: Payer: Self-pay | Admitting: Primary Care

## 2020-05-13 DIAGNOSIS — J302 Other seasonal allergic rhinitis: Secondary | ICD-10-CM

## 2020-06-07 ENCOUNTER — Ambulatory Visit: Payer: BC Managed Care – PPO | Admitting: Primary Care

## 2020-06-20 ENCOUNTER — Ambulatory Visit: Payer: BC Managed Care – PPO | Admitting: Primary Care

## 2020-06-25 ENCOUNTER — Ambulatory Visit (INDEPENDENT_AMBULATORY_CARE_PROVIDER_SITE_OTHER)
Admission: RE | Admit: 2020-06-25 | Discharge: 2020-06-25 | Disposition: A | Discharge status: Home / Self Care | Payer: BC Managed Care – PPO | Source: Ambulatory Visit | Attending: Primary Care | Admitting: Primary Care

## 2020-06-25 ENCOUNTER — Other Ambulatory Visit: Payer: Self-pay

## 2020-06-25 ENCOUNTER — Ambulatory Visit (INDEPENDENT_AMBULATORY_CARE_PROVIDER_SITE_OTHER): Payer: BC Managed Care – PPO | Admitting: Primary Care

## 2020-06-25 ENCOUNTER — Encounter: Payer: Self-pay | Admitting: Primary Care

## 2020-06-25 VITALS — BP 116/78 | HR 78 | Temp 96.7°F | Ht 66.0 in | Wt 142.2 lb

## 2020-06-25 DIAGNOSIS — M545 Low back pain, unspecified: Secondary | ICD-10-CM

## 2020-06-25 DIAGNOSIS — G8929 Other chronic pain: Secondary | ICD-10-CM

## 2020-06-25 DIAGNOSIS — M549 Dorsalgia, unspecified: Secondary | ICD-10-CM | POA: Diagnosis not present

## 2020-06-25 MED ORDER — PREDNISONE 20 MG PO TABS: ORAL_TABLET | ORAL | 0 refills | Status: DC

## 2020-06-25 NOTE — Assessment & Plan Note (Signed)
Chronic and intermittent for years, constant since March 2021. No alarm signs.  Suspect MSK involvement, but given chronic/daily symptoms, will need to treat.  Plain films of lumbar spine ordered and pending. Rx for prednisone course provided. Referral placed for PT.

## 2020-06-25 NOTE — Patient Instructions (Signed)
Complete xray(s) prior to leaving today. I will notify you of your results once received.  Start prednisone. Take 2 tablets daily for four days, then 1 tablet daily for four days.  You will be contacted regarding your referral to physical therapy.  Please let us know if you have not been contacted within two weeks.   It was a pleasure to see you today!

## 2020-06-25 NOTE — Progress Notes (Signed)
Subjective:    Patient ID: [name], female    DOB: Mar 29, 1986, 34 y.o.   MRN: [id number]  HPI  This visit occurred during the SARS-CoV-2 public health emergency.  Safety protocols were in place, including screening questions prior to the visit, additional usage of staff PPE, and extensive cleaning of exam room while observing appropriate contact time as indicated for disinfecting solutions.   [name] is a 34 year old female with a history of seasonal allergies and migraines who presents today with a chief complaint of chronic back pain.  Her pain is located to the right lower back with radiation around to her right hip. This originally began at the age of 77, with three separate incidences since, most recent incident occurring in March 2021. When triggered, her pain will "send me to the floor".   Her most recent episode was in March 2021, evaluated at urgent care at the time and was provided with an injection of Toradol, Rx for Flexeril, and home PT exercises. Historically she will be prescribed prednisone which completely relieves her pain, but in March 2021 she was never provided with prednisone. She's wondering if her constant pain from March 2021 is due to the fact that she never received prednisone.   Since then she's noticed constant dull pain to the right lower back, "feels like a cramp" and has also noticed a "knot" to the right lower side a few weeks ago. Since March she's taking precautions to prevent provoking her symptoms including wearing a back brace, home PT exercises, and other precautions.   Her pain is worse when waking up in the morning. She's not taking anything for pain. She has never seen physical therapy.   BP Readings from Last 3 Encounters:  06/25/20 116/78  03/13/20 114/76  06/27/19 116/80     Review of Systems  Musculoskeletal: Positive for arthralgias and back pain.  Skin: Negative for color change.  Neurological: Negative for numbness.       Past  Medical History:  Diagnosis Date  . Abdominal pain   . Chickenpox   . Diarrhea   . Fatigue   . Migraines   . Nausea   . Poor appetite   . Symptomatic cholelithiasis 06/24/2011  . Wears glasses   . Weight loss      Social History   Socioeconomic History  . Marital status: Married    Spouse name: Not on file  . Number of children: Not on file  . Years of education: Not on file  . Highest education level: Not on file  Occupational History  . Not on file  Tobacco Use  . Smoking status: Never Smoker  . Smokeless tobacco: Never Used  Substance and Sexual Activity  . Alcohol use: Yes    Comment: socially  . Drug use: Not Currently  . Sexual activity: Not on file  Other Topics Concern  . Not on file  Social History Narrative   Married.   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       Social Determinants of Health   Financial Resource Strain:   . Difficulty of Paying Living Expenses: Not on file  Food Insecurity:   . Worried About Programme researcher, broadcasting/film/video in the Last Year: Not on file  . Ran Out of Food in the Last Year: Not on file  Transportation Needs:   . Lack of Transportation (Medical): Not on file  . Lack of Transportation (Non-Medical): Not on file  Physical  Activity:   . Days of Exercise per Week: Not on file  . Minutes of Exercise per Session: Not on file  Stress:   . Feeling of Stress : Not on file  Social Connections:   . Frequency of Communication with Friends and Family: Not on file  . Frequency of Social Gatherings with Friends and Family: Not on file  . Attends Religious Services: Not on file  . Active Member of Clubs or Organizations: Not on file  . Attends Banker Meetings: Not on file  . Marital Status: Not on file  Intimate Partner Violence:   . Fear of Current or Ex-Partner: Not on file  . Emotionally Abused: Not on file  . Physically Abused: Not on file  . Sexually Abused: Not on file    Past Surgical History:  Procedure Laterality  Date  . CHOLECYSTECTOMY    . LAPAROSCOPIC CHOLECYSTECTOMY  06/28/11  . WISDOM TOOTH EXTRACTION      Family History  Problem Relation Age of Onset  . Cervical cancer Mother   . Multiple sclerosis Mother   . Arthritis Maternal Grandmother   . Hyperlipidemia Maternal Grandmother   . Stomach cancer Maternal Grandfather   . Lung cancer Paternal Grandfather     Allergies  Allergen Reactions  . Tioconazole Swelling    Patient states she had vaginal swelling only; no edema elsewhere.   . Amoxicillin-Pot Clavulanate   . Escitalopram     "made me angry" "made me angry"    Current Outpatient Medications on File Prior to Visit  Medication Sig Dispense Refill  . Norgestimate-Ethinyl Estradiol Triphasic (ORTHO TRI-CYCLEN LO) 0.18/0.215/0.25 MG-25 MCG tablet Take 1 tablet by mouth daily.       No current facility-administered medications on file prior to visit.    BP 116/78   Pulse 78   Temp (!) 96.7 F (35.9 C) (Temporal)   Ht 5\' 6"  (1.676 m)   Wt 142 lb 4 oz (64.5 kg)   LMP 06/20/2020   SpO2 98%   BMI 22.96 kg/m    Objective:   Physical Exam Musculoskeletal:     Lumbar back: No tenderness or bony tenderness. Decreased range of motion.       Back:     Comments: Slight decrease in ROM with flexion and extension of lumbar spine due to pain. Negative straight leg raise bilaterally, 5/5 strength to bilateral lower extremities.   Skin:    General: Skin is warm and dry.            Assessment & Plan:

## 2020-07-03 DIAGNOSIS — G8929 Other chronic pain: Secondary | ICD-10-CM | POA: Diagnosis not present

## 2020-07-03 DIAGNOSIS — M5440 Lumbago with sciatica, unspecified side: Secondary | ICD-10-CM | POA: Diagnosis not present

## 2020-07-10 DIAGNOSIS — M5440 Lumbago with sciatica, unspecified side: Secondary | ICD-10-CM | POA: Diagnosis not present

## 2020-07-10 DIAGNOSIS — G8929 Other chronic pain: Secondary | ICD-10-CM | POA: Diagnosis not present

## 2020-07-17 DIAGNOSIS — M5440 Lumbago with sciatica, unspecified side: Secondary | ICD-10-CM | POA: Diagnosis not present

## 2020-07-17 DIAGNOSIS — G8929 Other chronic pain: Secondary | ICD-10-CM | POA: Diagnosis not present

## 2020-07-25 DIAGNOSIS — G8929 Other chronic pain: Secondary | ICD-10-CM | POA: Diagnosis not present

## 2020-07-25 DIAGNOSIS — M5440 Lumbago with sciatica, unspecified side: Secondary | ICD-10-CM | POA: Diagnosis not present

## 2020-11-13 ENCOUNTER — Ambulatory Visit: Payer: BC Managed Care – PPO | Admitting: Primary Care

## 2021-01-09 ENCOUNTER — Encounter: Payer: Self-pay | Admitting: Primary Care

## 2021-01-09 ENCOUNTER — Other Ambulatory Visit: Payer: Self-pay

## 2021-01-09 ENCOUNTER — Ambulatory Visit: Payer: BC Managed Care – PPO | Admitting: Primary Care

## 2021-01-09 VITALS — BP 110/78 | HR 82 | Temp 98.2°F | Ht 66.0 in | Wt 149.0 lb

## 2021-01-09 DIAGNOSIS — G8929 Other chronic pain: Secondary | ICD-10-CM

## 2021-01-09 DIAGNOSIS — M545 Low back pain, unspecified: Secondary | ICD-10-CM

## 2021-01-09 MED ORDER — CYCLOBENZAPRINE HCL 5 MG PO TABS: 5.0000 mg | ORAL_TABLET | Freq: Three times a day (TID) | ORAL | 0 refills | Status: DC | PRN

## 2021-01-09 MED ORDER — PREDNISONE 20 MG PO TABS: ORAL_TABLET | ORAL | 0 refills | Status: DC

## 2021-01-09 NOTE — Assessment & Plan Note (Signed)
Chronic and continued x 1 year. No alarm signs on exam.  Given history, coupled with negative lumbar plain films and no improvement with PT, will obtain MRI.  Will also prescribe prednisone 20 mg course x 8 days along with cyclobenzaprine 5 mg to use PRN.  MRI ordered.

## 2021-01-09 NOTE — Patient Instructions (Signed)
Start prednisone 20 mg. Take 2 tablets by mouth once daily for four days, then 1 tablet daily for four days.  You may take the cyclobenzaprine (Flexeril) 5 mg tablet every 8 hours as needed for muscle spasms. This may cause drowsiness.   You will be contacted regarding your MRI.  Please let us know if you have not been contacted within two weeks. [facility] [address] in [place].   It was a pleasure to see you today!

## 2021-01-09 NOTE — Progress Notes (Signed)
Subjective:     [name] is a 35 y.o. female presenting for Back Pain (Has continued from last visit. PT did not help would like to have MRI )     HPI  [name] is a 35 year old female with a history of chronic right sided low back pain who presents today to discuss low back pian.   She was last evaluated in August 2021 for symptoms of right lower back pain with radiation around to her right hip. Chronic symptoms since the age of 66. During her visit in August 2021 lumbar plain films were ordered which were unremarkable. She was sent to physical therapy for treatment.   Since her last visit she completed several sessions of physical therapy for which she's continued at home. No resolve. She works as a Data processing manager, tries to avoid heavy lifting, does a lot of squatting as she cannot bend forward. She has to lean on her bed to get her pants on in the morning.  Her pain is constant, throbbing located to the right lower back with radiation to right buttocks. She's never undergone MRI. She would like a refill of her cyclobenzaprine which helps temporarily, uses sparingly.   She denies loss of bowel/bladder control, groin numbness.    Review of Systems  Genitourinary:       Denies loss of bowel/bladder control  Musculoskeletal: Positive for arthralgias, back pain and myalgias.  Neurological: Negative for numbness.     Social History   Tobacco Use  Smoking Status Never Smoker  Smokeless Tobacco Never Used        Past Medical History:  Diagnosis Date  . Abdominal pain   . Chickenpox   . Diarrhea   . Fatigue   . Migraines   . Nausea   . Poor appetite   . Symptomatic cholelithiasis 06/24/2011  . Wears glasses   . Weight loss      Social History   Socioeconomic History  . Marital status: Married    Spouse name: Not on file  . Number of children: Not on file  . Years of education: Not on file  . Highest education level: Not on file  Occupational History  .  Not on file  Tobacco Use  . Smoking status: Never Smoker  . Smokeless tobacco: Never Used  Substance and Sexual Activity  . Alcohol use: Yes    Comment: socially  . Drug use: Not Currently  . Sexual activity: Not on file  Other Topics Concern  . Not on file  Social History Narrative   Married.   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       Social Determinants of Health   Financial Resource Strain: Not on file  Food Insecurity: Not on file  Transportation Needs: Not on file  Physical Activity: Not on file  Stress: Not on file  Social Connections: Not on file  Intimate Partner Violence: Not on file    Past Surgical History:  Procedure Laterality Date  . CHOLECYSTECTOMY    . LAPAROSCOPIC CHOLECYSTECTOMY  06/28/11  . WISDOM TOOTH EXTRACTION      Family History  Problem Relation Age of Onset  . Cervical cancer Mother   . Multiple sclerosis Mother   . Arthritis Maternal Grandmother   . Hyperlipidemia Maternal Grandmother   . Stomach cancer Maternal Grandfather   . Lung cancer Paternal Grandfather     Allergies  Allergen Reactions  . Tioconazole Swelling    Patient states  she had vaginal swelling only; no edema elsewhere.   . Amoxicillin-Pot Clavulanate   . Escitalopram     "made me angry" "made me angry"    Current Outpatient Medications on File Prior to Visit  Medication Sig Dispense Refill  . Norgestimate-Ethinyl Estradiol Triphasic 0.18/0.215/0.25 MG-25 MCG tab Take 1 tablet by mouth daily.     No current facility-administered medications on file prior to visit.    BP 110/78   Pulse 82   Temp 98.2 F (36.8 C) (Temporal)   Ht 5\' 6"  (1.676 m)   Wt 149 lb (67.6 kg)   SpO2 97%   BMI 24.05 kg/m    Objective:    BP Readings from Last 3 Encounters:  01/09/21 110/78  06/25/20 116/78  03/13/20 114/76   Wt Readings from Last 3 Encounters:  01/09/21 149 lb (67.6 kg)  06/25/20 142 lb 4 oz (64.5 kg)  03/13/20 142 lb (64.4 kg)    BP 110/78   Pulse 82    Temp 98.2 F (36.8 C) (Temporal)   Ht 5\' 6"  (1.676 m)   Wt 149 lb (67.6 kg)   SpO2 97%   BMI 24.05 kg/m    Physical Exam Constitutional:      Appearance: She is well-nourished.  Cardiovascular:     Rate and Rhythm: Normal rate and regular rhythm.  Pulmonary:     Effort: Pulmonary effort is normal.     Breath sounds: Normal breath sounds.  Musculoskeletal:     Cervical back: Neck supple.     Lumbar back: No tenderness or bony tenderness. Decreased range of motion. Negative right straight leg raise test and negative left straight leg raise test.       Back:     Comments: Decrease in ROM with pain with lumbar flexion. Negative SLR. 5/5 strength to bilateral lower extremities. Altered ability to get onto exam table.   Skin:    General: Skin is warm and dry.  Psychiatric:        Mood and Affect: Mood and affect normal.           Assessment & Plan:   Problem List Items Addressed This Visit   None      No follow-ups on file.  05/13/20, NP  This visit occurred during the SARS-CoV-2 public health emergency.  Safety protocols were in place, including screening questions prior to the visit, additional usage of staff PPE, and extensive cleaning of exam room while observing appropriate contact time as indicated for disinfecting solutions.

## 2021-01-11 ENCOUNTER — Other Ambulatory Visit: Payer: Self-pay | Admitting: Primary Care

## 2021-01-11 DIAGNOSIS — M545 Low back pain, unspecified: Secondary | ICD-10-CM

## 2021-02-11 ENCOUNTER — Other Ambulatory Visit: Payer: Self-pay | Admitting: Primary Care

## 2021-02-11 DIAGNOSIS — J302 Other seasonal allergic rhinitis: Secondary | ICD-10-CM

## 2021-02-11 DIAGNOSIS — B379 Candidiasis, unspecified: Secondary | ICD-10-CM | POA: Diagnosis not present

## 2021-02-11 DIAGNOSIS — N760 Acute vaginitis: Secondary | ICD-10-CM | POA: Diagnosis not present

## 2021-02-15 DIAGNOSIS — M545 Low back pain, unspecified: Secondary | ICD-10-CM | POA: Diagnosis not present

## 2021-02-26 DIAGNOSIS — M545 Low back pain, unspecified: Secondary | ICD-10-CM | POA: Diagnosis not present

## 2021-03-04 DIAGNOSIS — Z1329 Encounter for screening for other suspected endocrine disorder: Secondary | ICD-10-CM | POA: Diagnosis not present

## 2021-03-04 DIAGNOSIS — Z6825 Body mass index (BMI) 25.0-25.9, adult: Secondary | ICD-10-CM | POA: Diagnosis not present

## 2021-03-04 DIAGNOSIS — Z113 Encounter for screening for infections with a predominantly sexual mode of transmission: Secondary | ICD-10-CM | POA: Diagnosis not present

## 2021-03-04 DIAGNOSIS — Z3041 Encounter for surveillance of contraceptive pills: Secondary | ICD-10-CM | POA: Diagnosis not present

## 2021-03-04 DIAGNOSIS — Z13220 Encounter for screening for lipoid disorders: Secondary | ICD-10-CM | POA: Diagnosis not present

## 2021-03-04 DIAGNOSIS — Z01419 Encounter for gynecological examination (general) (routine) without abnormal findings: Secondary | ICD-10-CM | POA: Diagnosis not present

## 2021-03-04 DIAGNOSIS — Z124 Encounter for screening for malignant neoplasm of cervix: Secondary | ICD-10-CM | POA: Diagnosis not present

## 2021-03-04 DIAGNOSIS — Z0000 Encounter for general adult medical examination without abnormal findings: Secondary | ICD-10-CM | POA: Diagnosis not present

## 2021-03-04 LAB — HM PAP SMEAR

## 2021-03-04 LAB — RESULTS CONSOLE HPV: CHL HPV: NEGATIVE

## 2021-03-29 DIAGNOSIS — M5416 Radiculopathy, lumbar region: Secondary | ICD-10-CM | POA: Diagnosis not present

## 2021-05-01 DIAGNOSIS — M5416 Radiculopathy, lumbar region: Secondary | ICD-10-CM | POA: Diagnosis not present

## 2021-05-08 ENCOUNTER — Other Ambulatory Visit: Payer: Self-pay | Admitting: Primary Care

## 2021-05-08 DIAGNOSIS — J302 Other seasonal allergic rhinitis: Secondary | ICD-10-CM

## 2021-05-24 DIAGNOSIS — M5416 Radiculopathy, lumbar region: Secondary | ICD-10-CM | POA: Diagnosis not present

## 2021-07-07 DIAGNOSIS — R350 Frequency of micturition: Secondary | ICD-10-CM | POA: Diagnosis not present

## 2021-07-07 DIAGNOSIS — B373 Candidiasis of vulva and vagina: Secondary | ICD-10-CM | POA: Diagnosis not present

## 2021-07-07 DIAGNOSIS — R8290 Unspecified abnormal findings in urine: Secondary | ICD-10-CM | POA: Diagnosis not present

## 2021-07-07 DIAGNOSIS — R82998 Other abnormal findings in urine: Secondary | ICD-10-CM | POA: Diagnosis not present

## 2021-07-23 DIAGNOSIS — L739 Follicular disorder, unspecified: Secondary | ICD-10-CM | POA: Diagnosis not present

## 2021-09-19 ENCOUNTER — Encounter: Payer: Self-pay | Admitting: Family Medicine

## 2021-09-19 ENCOUNTER — Other Ambulatory Visit: Payer: Self-pay

## 2021-09-19 ENCOUNTER — Telehealth (INDEPENDENT_AMBULATORY_CARE_PROVIDER_SITE_OTHER): Payer: BC Managed Care – PPO | Admitting: Family Medicine

## 2021-09-19 DIAGNOSIS — J209 Acute bronchitis, unspecified: Secondary | ICD-10-CM | POA: Insufficient documentation

## 2021-09-19 MED ORDER — PREDNISONE 10 MG PO TABS: ORAL_TABLET | ORAL | 0 refills | Status: DC

## 2021-09-19 MED ORDER — FLUCONAZOLE 150 MG PO TABS: 150.0000 mg | ORAL_TABLET | Freq: Once | ORAL | 0 refills | Status: AC

## 2021-09-19 NOTE — Assessment & Plan Note (Signed)
Viral with wheezy cough and congestion  Out of the window to test for covid or flu  Discussed symptom management  Tessalon has not helped in the past  inst to start back with mucinex DM Prednisone 40 mg taper sent to pharmacy  Discussed potential side effects  ER precautions reviewed  Update if not starting to improve in a week or if worsening

## 2021-09-19 NOTE — Patient Instructions (Signed)
Drink fluids and rest when you can  Take the prednisone taper as directed  Mucinex DM for cough and congestion  Continue your allergy symptoms   If severe symptoms or trouble breathing please go to the ER  Update if not starting to improve in a week or if worsening

## 2021-09-19 NOTE — Progress Notes (Signed)
Virtual Visit via Video Note  I connected with [name] on 09/19/21 at 12:00 PM EST by a video enabled telemedicine application and verified that I am speaking with the correct person using two identifiers.  Location: Patient: outside at work /vet office Provider: office    I discussed the limitations of evaluation and management by telemedicine and the availability of in person appointments. The patient expressed understanding and agreed to proceed.  Parties involved in encounter  Patient: [name]  Provider:  Roxy Manns MD   History of Present Illness: 35 yo pt of NP [facility] presents with uri symptoms   Symptoms for about a month (thought it was allergies, lost voice and did not feel too bad)  Got better and then worse again last week  No fever  Cough -is wheezy and hurts her chest  Worse with cold air  Chest feels tight  In the am productive, mucous is clear to slt color Feels like it is a little hard to get air  Chest congestion  Head congestion worse at night  Some sinus pressure /frontal   Throat-had a ST a week ago and now gone /sore from drainage Ears -popping/ not hurting   H/o allergy problems this year   No recent covid test   Otc Zyrtec-am  Singulair -pm These helped cough in the past so started it back  Mucinex DM  Nyquil    Patient Active Problem List   Diagnosis Date Noted   Acute bronchitis 09/19/2021   Chronic right-sided low back pain 06/25/2020   Seasonal allergies 02/02/2020   Migraines 06/27/2019   Abnormal Pap smear 09/06/2013   Past Medical History:  Diagnosis Date   Abdominal pain    Chickenpox    Diarrhea    Fatigue    Migraines    Nausea    Poor appetite    Symptomatic cholelithiasis 06/24/2011   Wears glasses    Weight loss    Past Surgical History:  Procedure Laterality Date   CHOLECYSTECTOMY     LAPAROSCOPIC CHOLECYSTECTOMY  06/28/11   WISDOM TOOTH EXTRACTION     Social History   Tobacco Use   Smoking  status: Never   Smokeless tobacco: Never  Substance Use Topics   Alcohol use: Yes    Comment: socially   Drug use: Not Currently   Family History  Problem Relation Age of Onset   Cervical cancer Mother    Multiple sclerosis Mother    Arthritis Maternal Grandmother    Hyperlipidemia Maternal Grandmother    Stomach cancer Maternal Grandfather    Lung cancer Paternal Grandfather    Allergies  Allergen Reactions   Tioconazole Swelling    Patient states she had vaginal swelling only; no edema elsewhere.    Amoxicillin-Pot Clavulanate    Escitalopram     "made me angry" "made me angry"   Current Outpatient Medications on File Prior to Visit  Medication Sig Dispense Refill   cetirizine (ZYRTEC) 10 MG tablet TAKE 1 TABLET (10 MG TOTAL) BY MOUTH DAILY. FOR ALLERGIES. 90 tablet 0   meloxicam (MOBIC) 7.5 MG tablet Take 7.5 mg by mouth daily. As needed     montelukast (SINGULAIR) 10 MG tablet TAKE 1 TABLET BY MOUTH AT BEDTIME FOR ALLERGIES 90 tablet 0   Norgestimate-Ethinyl Estradiol Triphasic 0.18/0.215/0.25 MG-25 MCG tab Take 1 tablet by mouth daily.     tiZANidine (ZANAFLEX) 4 MG tablet Take by mouth. As needed     No current facility-administered medications on  file prior to visit.   Review of Systems  Constitutional:  Negative for chills, fever and malaise/fatigue.  HENT:  Positive for congestion and sore throat. Negative for ear pain and sinus pain.   Eyes:  Negative for blurred vision, discharge and redness.  Respiratory:  Positive for cough and sputum production. Negative for shortness of breath, wheezing and stridor.        Not wheezing but cough sounds wheezy  Cardiovascular:  Negative for chest pain, palpitations and leg swelling.  Gastrointestinal:  Negative for abdominal pain, diarrhea, nausea and vomiting.  Musculoskeletal:  Negative for myalgias.  Skin:  Negative for rash.  Neurological:  Positive for headaches. Negative for dizziness.    Observations/Objective: Patient appears well, in no distress Weight is baseline  No facial swelling or asymmetry Voice is mildly hoarse No obvious tremor or mobility impairment Moving neck and UEs normally Able to hear the call well  No wheeze or shortness of breath during interview  Cough sounds harsh and tight  Talkative and mentally sharp with no cognitive changes No skin changes on face or neck , no rash or pallor Affect is normal    Assessment and Plan: Problem List Items Addressed This Visit       Respiratory   Acute bronchitis    Viral with wheezy cough and congestion  Out of the window to test for covid or flu  Discussed symptom management  Tessalon has not helped in the past  inst to start back with mucinex DM Prednisone 40 mg taper sent to pharmacy  Discussed potential side effects  ER precautions reviewed  Update if not starting to improve in a week or if worsening          Follow Up Instructions: Drink fluids and rest when you can  Take the prednisone taper as directed  Mucinex DM for cough and congestion  Continue your allergy symptoms   If severe symptoms or trouble breathing please go to the ER  Update if not starting to improve in a week or if worsening    I discussed the assessment and treatment plan with the patient. The patient was provided an opportunity to ask questions and all were answered. The patient agreed with the plan and demonstrated an understanding of the instructions.   The patient was advised to call back or seek an in-person evaluation if the symptoms worsen or if the condition fails to improve as anticipated.    Roxy Manns, MD

## 2021-10-18 ENCOUNTER — Encounter: Payer: Self-pay | Admitting: Primary Care

## 2021-10-18 ENCOUNTER — Other Ambulatory Visit: Payer: Self-pay

## 2021-10-18 ENCOUNTER — Ambulatory Visit (INDEPENDENT_AMBULATORY_CARE_PROVIDER_SITE_OTHER): Payer: BC Managed Care – PPO

## 2021-10-18 ENCOUNTER — Ambulatory Visit: Payer: BC Managed Care – PPO | Admitting: Primary Care

## 2021-10-18 DIAGNOSIS — R053 Chronic cough: Secondary | ICD-10-CM

## 2021-10-18 DIAGNOSIS — R059 Cough, unspecified: Secondary | ICD-10-CM | POA: Diagnosis not present

## 2021-10-18 DIAGNOSIS — J302 Other seasonal allergic rhinitis: Secondary | ICD-10-CM | POA: Diagnosis not present

## 2021-10-18 DIAGNOSIS — M545 Low back pain, unspecified: Secondary | ICD-10-CM | POA: Diagnosis not present

## 2021-10-18 DIAGNOSIS — G8929 Other chronic pain: Secondary | ICD-10-CM | POA: Diagnosis not present

## 2021-10-18 MED ORDER — MELOXICAM 7.5 MG PO TABS: 7.5000 mg | ORAL_TABLET | Freq: Two times a day (BID) | ORAL | 0 refills | Status: DC | PRN

## 2021-10-18 MED ORDER — OMEPRAZOLE 40 MG PO CPDR: 40.0000 mg | DELAYED_RELEASE_CAPSULE | Freq: Every day | ORAL | 0 refills | Status: DC

## 2021-10-18 NOTE — Assessment & Plan Note (Signed)
Suspect a combination of GERD and allergies.  Checking chest xray today. Rx for omeprazole 40 mg sent to pharmacy to start HS. Resume Zyrtec 10 mg daily.  She will update in 1 week. If no improvement then will try a burst of prednisone 40 mg daily x 5-7 days. Consider PFT's.

## 2021-10-18 NOTE — Assessment & Plan Note (Signed)
Do suspect allergies are contributing to persistent cough.  Resume Zyrtec daily.

## 2021-10-18 NOTE — Assessment & Plan Note (Signed)
No longer following with Ortho, doesn't want to see pain management.  She is doing well on PRN meloxicam 7.5 mg and tizanidine 4 mg, continue same.  Refills provided for Meloxicam 7.5 mg to use BID PRN.

## 2021-10-18 NOTE — Progress Notes (Signed)
Subjective:    Patient ID: [name], female    DOB: 1986/10/06, 35 y.o.   MRN: [id number]  HPI  [name] is a very pleasant 35 y.o. female with a history of migraines, bronchitis, seasonal allergies who presents today to discuss cough and for follow up of chronic back pain.  1) Chronic Cough:  Evaluated by Dr. Milinda Antis virtually on 09/19/21 for a one month history of URI symptoms for which she thought to be secondary to allergies. Symptoms included cough, wheezing, chest tightness, chest congestion, sinus pressure, ears popping, post nasal drip. During this visit she was suspected to have viral URI and was treated with Tessalon Perles and prednisone 40 mg taper. She was advised to update one week later if no improvement.  Since her visit with Dr. Milinda Antis her symptoms have significantly improved but she continues to notice a cough with coughing fits, wheezing, chest tightness. Cough occurs during the day, worse at night and in the AM when she first wakes. She feels that the cough now is mostly to her throat.   Today she endorses intermittent cough throughout the last year, occurs for about 1 month at a time. Was evaluated last year for persistent cough, improved with Singulair and Zyrtec with improvement.   She stopped taking Singulair and Zyrtec about 2 months ago as she felt it was not working. She has been taking famotidine OTC intermittently at night due to heartburn symptoms.   She denies a history of asthma as a child. She feels most everything in her throat and upper chest, not deep in her lungs.  2) Chronic Back Pain: Chronic to the right lower side for years. She underwent MRI which revealed a "bulging disc" with pinched nerve. Following with [facility] who completed a back injection and referred her to pain management. The injection helped somewhat.   She does not want to follow with pain management. Has not seen her orthopedist recently.  She was prescribed meloxicam 7.5  for which she takes once daily as needed for flares, this has been working well.  She is needing refills today. She was also prescribed tizanidine 4 mg PRN, she uses sparingly.   Review of Systems  Constitutional:  Negative for chills and fever.  HENT:  Positive for postnasal drip. Negative for congestion.   Respiratory:  Positive for cough, chest tightness and wheezing.   Gastrointestinal:        GERD symptoms        Past Medical History:  Diagnosis Date   Abdominal pain    Chickenpox    Diarrhea    Fatigue    Migraines    Nausea    Poor appetite    Symptomatic cholelithiasis 06/24/2011   Wears glasses    Weight loss     Social History   Socioeconomic History   Marital status: Married    Spouse name: Not on file   Number of children: Not on file   Years of education: Not on file   Highest education level: Not on file  Occupational History   Not on file  Tobacco Use   Smoking status: Never   Smokeless tobacco: Never  Substance and Sexual Activity   Alcohol use: Yes    Comment: socially   Drug use: Not Currently   Sexual activity: Not on file  Other Topics Concern   Not on file  Social History Narrative   Married.   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  Social Determinants of Health   Financial Resource Strain: Not on file  Food Insecurity: Not on file  Transportation Needs: Not on file  Physical Activity: Not on file  Stress: Not on file  Social Connections: Not on file  Intimate Partner Violence: Not on file    Past Surgical History:  Procedure Laterality Date   CHOLECYSTECTOMY     LAPAROSCOPIC CHOLECYSTECTOMY  06/28/11   WISDOM TOOTH EXTRACTION      Family History  Problem Relation Age of Onset   Cervical cancer Mother    Multiple sclerosis Mother    Arthritis Maternal Grandmother    Hyperlipidemia Maternal Grandmother    Stomach cancer Maternal Grandfather    Lung cancer Paternal Grandfather     Allergies  Allergen Reactions    Tioconazole Swelling    Patient states she had vaginal swelling only; no edema elsewhere.    Amoxicillin-Pot Clavulanate    Escitalopram     "made me angry" "made me angry"    Current Outpatient Medications on File Prior to Visit  Medication Sig Dispense Refill   meloxicam (MOBIC) 7.5 MG tablet Take 7.5 mg by mouth daily. As needed     Norgestimate-Ethinyl Estradiol Triphasic 0.18/0.215/0.25 MG-25 MCG tab Take 1 tablet by mouth daily.     tiZANidine (ZANAFLEX) 4 MG tablet Take by mouth. As needed     cetirizine (ZYRTEC) 10 MG tablet TAKE 1 TABLET (10 MG TOTAL) BY MOUTH DAILY. FOR ALLERGIES. (Patient not taking: Reported on 10/18/2021) 90 tablet 0   montelukast (SINGULAIR) 10 MG tablet TAKE 1 TABLET BY MOUTH AT BEDTIME FOR ALLERGIES (Patient not taking: Reported on 10/18/2021) 90 tablet 0   No current facility-administered medications on file prior to visit.    BP 110/64    Pulse (!) 102    Temp 97.8 F (36.6 C) (Temporal)    Ht 5\' 6"  (1.676 m)    Wt 152 lb (68.9 kg)    LMP 09/19/2021    SpO2 94%    BMI 24.53 kg/m  Objective:   Physical Exam HENT:     Right Ear: Tympanic membrane and ear canal normal.     Left Ear: Tympanic membrane and ear canal normal.     Nose:     Right Sinus: No maxillary sinus tenderness or frontal sinus tenderness.     Left Sinus: No maxillary sinus tenderness or frontal sinus tenderness.     Mouth/Throat:     Pharynx: No posterior oropharyngeal erythema.  Eyes:     Conjunctiva/sclera: Conjunctivae normal.  Cardiovascular:     Rate and Rhythm: Normal rate and regular rhythm.  Pulmonary:     Effort: Pulmonary effort is normal.     Breath sounds: Normal breath sounds. No wheezing or rales.  Musculoskeletal:     Cervical back: Neck supple.  Lymphadenopathy:     Cervical: No cervical adenopathy.  Skin:    General: Skin is warm and dry.          Assessment & Plan:      This visit occurred during the SARS-CoV-2 public health emergency.  Safety  protocols were in place, including screening questions prior to the visit, additional usage of staff PPE, and extensive cleaning of exam room while observing appropriate contact time as indicated for disinfecting solutions.

## 2021-10-18 NOTE — Patient Instructions (Signed)
Complete xray(s) prior to leaving today. I will notify you of your results once received.  Start omeprazole 40 mg at bedtime for cough and heartburn. Take this every night.  Resume Zyrtec 10 mg daily for allergies.  Please update me in 1-2 weeks.  It was a pleasure to see you today!

## 2021-10-20 ENCOUNTER — Other Ambulatory Visit: Payer: Self-pay | Admitting: Primary Care

## 2021-10-20 DIAGNOSIS — R053 Chronic cough: Secondary | ICD-10-CM

## 2021-10-20 MED ORDER — AZITHROMYCIN 250 MG PO TABS: ORAL_TABLET | ORAL | 0 refills | Status: DC

## 2021-10-28 DIAGNOSIS — R9389 Abnormal findings on diagnostic imaging of other specified body structures: Secondary | ICD-10-CM

## 2021-10-28 DIAGNOSIS — R053 Chronic cough: Secondary | ICD-10-CM

## 2021-10-28 DIAGNOSIS — J209 Acute bronchitis, unspecified: Secondary | ICD-10-CM

## 2021-10-29 ENCOUNTER — Other Ambulatory Visit: Payer: Self-pay | Admitting: Primary Care

## 2021-10-29 DIAGNOSIS — J209 Acute bronchitis, unspecified: Secondary | ICD-10-CM

## 2021-10-29 DIAGNOSIS — R053 Chronic cough: Secondary | ICD-10-CM

## 2021-10-29 NOTE — Telephone Encounter (Signed)
Sent message to patient to let know you are out of the office and will address once back. If any urgent concerns to call and speak to triage nurse.

## 2021-10-29 NOTE — Addendum Note (Signed)
Addended by: Doreene Nest on: 10/29/2021 06:49 PM   Modules accepted: Orders

## 2021-11-09 ENCOUNTER — Other Ambulatory Visit: Payer: Self-pay | Admitting: Primary Care

## 2021-11-09 DIAGNOSIS — R053 Chronic cough: Secondary | ICD-10-CM

## 2021-11-11 ENCOUNTER — Ambulatory Visit
Admission: RE | Admit: 2021-11-11 | Discharge: 2021-11-11 | Disposition: A | Discharge status: Home / Self Care | Payer: BC Managed Care – PPO | Attending: Primary Care | Admitting: Primary Care

## 2021-11-11 ENCOUNTER — Ambulatory Visit
Admission: RE | Admit: 2021-11-11 | Discharge: 2021-11-11 | Disposition: A | Discharge status: Home / Self Care | Payer: BC Managed Care – PPO | Source: Ambulatory Visit | Attending: Primary Care | Admitting: Primary Care

## 2021-11-11 ENCOUNTER — Other Ambulatory Visit: Payer: Self-pay

## 2021-11-11 DIAGNOSIS — J209 Acute bronchitis, unspecified: Secondary | ICD-10-CM

## 2021-11-11 DIAGNOSIS — R053 Chronic cough: Secondary | ICD-10-CM | POA: Insufficient documentation

## 2021-11-11 DIAGNOSIS — R059 Cough, unspecified: Secondary | ICD-10-CM | POA: Diagnosis not present

## 2021-11-12 NOTE — Addendum Note (Signed)
Addended by: Doreene Nest on: 11/12/2021 07:01 AM   Modules accepted: Orders

## 2021-11-16 ENCOUNTER — Other Ambulatory Visit: Payer: Self-pay

## 2021-11-16 ENCOUNTER — Ambulatory Visit (HOSPITAL_BASED_OUTPATIENT_CLINIC_OR_DEPARTMENT_OTHER)
Admission: RE | Admit: 2021-11-16 | Discharge: 2021-11-16 | Disposition: A | Discharge status: Home / Self Care | Payer: BC Managed Care – PPO | Source: Ambulatory Visit | Attending: Primary Care | Admitting: Primary Care

## 2021-11-16 DIAGNOSIS — J439 Emphysema, unspecified: Secondary | ICD-10-CM | POA: Diagnosis not present

## 2021-11-16 DIAGNOSIS — R911 Solitary pulmonary nodule: Secondary | ICD-10-CM | POA: Diagnosis not present

## 2021-11-16 DIAGNOSIS — R918 Other nonspecific abnormal finding of lung field: Secondary | ICD-10-CM | POA: Diagnosis not present

## 2021-11-16 DIAGNOSIS — R053 Chronic cough: Secondary | ICD-10-CM | POA: Insufficient documentation

## 2021-11-16 DIAGNOSIS — R9389 Abnormal findings on diagnostic imaging of other specified body structures: Secondary | ICD-10-CM | POA: Diagnosis not present

## 2021-11-16 DIAGNOSIS — J479 Bronchiectasis, uncomplicated: Secondary | ICD-10-CM | POA: Diagnosis not present

## 2021-11-16 MED ORDER — IOHEXOL 300 MG/ML  SOLN
100.0000 mL | Freq: Once | INTRAMUSCULAR | Status: AC | PRN
  Administered 2021-11-16: 75 mL via INTRAVENOUS

## 2021-11-17 ENCOUNTER — Other Ambulatory Visit: Payer: Self-pay | Admitting: Primary Care

## 2021-11-17 DIAGNOSIS — J8489 Other specified interstitial pulmonary diseases: Secondary | ICD-10-CM

## 2021-11-17 DIAGNOSIS — R053 Chronic cough: Secondary | ICD-10-CM

## 2021-11-20 ENCOUNTER — Other Ambulatory Visit: Payer: Self-pay | Admitting: Primary Care

## 2021-11-20 DIAGNOSIS — R053 Chronic cough: Secondary | ICD-10-CM

## 2021-12-17 ENCOUNTER — Encounter: Payer: Self-pay | Admitting: Pulmonary Disease

## 2021-12-17 ENCOUNTER — Ambulatory Visit: Payer: BC Managed Care – PPO | Admitting: Pulmonary Disease

## 2021-12-17 ENCOUNTER — Other Ambulatory Visit
Admission: RE | Admit: 2021-12-17 | Discharge: 2021-12-17 | Disposition: A | Discharge status: Home / Self Care | Payer: BC Managed Care – PPO | Source: Ambulatory Visit | Attending: Pulmonary Disease | Admitting: Pulmonary Disease

## 2021-12-17 ENCOUNTER — Other Ambulatory Visit: Payer: Self-pay

## 2021-12-17 VITALS — BP 116/78 | HR 92 | Temp 98.1°F | Ht 65.0 in | Wt 157.4 lb

## 2021-12-17 DIAGNOSIS — Z1152 Encounter for screening for COVID-19: Secondary | ICD-10-CM

## 2021-12-17 DIAGNOSIS — K219 Gastro-esophageal reflux disease without esophagitis: Secondary | ICD-10-CM

## 2021-12-17 DIAGNOSIS — Z01812 Encounter for preprocedural laboratory examination: Secondary | ICD-10-CM | POA: Insufficient documentation

## 2021-12-17 DIAGNOSIS — R053 Chronic cough: Secondary | ICD-10-CM

## 2021-12-17 DIAGNOSIS — M359 Systemic involvement of connective tissue, unspecified: Secondary | ICD-10-CM

## 2021-12-17 DIAGNOSIS — Z20822 Contact with and (suspected) exposure to covid-19: Secondary | ICD-10-CM | POA: Diagnosis not present

## 2021-12-17 DIAGNOSIS — J849 Interstitial pulmonary disease, unspecified: Secondary | ICD-10-CM | POA: Diagnosis not present

## 2021-12-17 LAB — SARS CORONAVIRUS 2 (TAT 6-24 HRS): SARS Coronavirus 2: NEGATIVE

## 2021-12-17 NOTE — Progress Notes (Signed)
Subjective:    Patient ID: [name], female    DOB: 11/03/1986, 36 y.o.   MRN: [id number] Chief Complaint  Patient presents with   Consult    HPI The patient is a 36 year old lifelong never smoker who presents for evaluation of abnormal findings on CT chest.  She is kindly referred by Alma Friendly, NP.  Patient does note that since October 2022 she has had gradual increase of shortness of breath and a nonproductive cough.  Cough is worse mostly in the morning and at nighttime.  Cough can actually be aggravated when she is active.  Resting makes the cough better.  She has not noticed any joint pain or myalgias.  She has noted that over the years she gets seasonal changes in her symptoms that include dry cough she states that when this happens it usually lasts for approximately 2 months and then goes away.  She notes that Zyrtec and Singulair usually help her as well.  She notices that cough can sometimes be "deep".  No sputum production per se no hemoptysis.  She does note issues with gastroesophageal reflux for which she takes omeprazole with varying degrees of control.  No orthopnea or paroxysmal nocturnal dyspnea.  No lower extremity edema.  In November 2022 she was given some prednisone which made her symptoms somewhat better.  On review of systems she does not endorse any other significant symptomatology.  The patient is a lifelong never smoker, she works as a Engineer, site.  Occasionally deals with exotic animals but not as a general rule.   Review of Systems A 10 point review of systems was performed and it is as noted above otherwise negative.  Past Medical History:  Diagnosis Date   Abdominal pain    Chickenpox    Diarrhea    Fatigue    Migraines    Nausea    Poor appetite    Symptomatic cholelithiasis 06/24/2011   Wears glasses    Weight loss    Past Surgical History:  Procedure Laterality Date   CHOLECYSTECTOMY     LAPAROSCOPIC CHOLECYSTECTOMY  06/28/11    WISDOM TOOTH EXTRACTION     Patient Active Problem List   Diagnosis Date Noted   Persistent cough for 3 weeks or longer 10/18/2021   Acute bronchitis 09/19/2021   Chronic right-sided low back pain 06/25/2020   Seasonal allergies 02/02/2020   Migraines 06/27/2019   Abnormal Pap smear 09/06/2013   Family History  Problem Relation Age of Onset   Cervical cancer Mother    Multiple sclerosis Mother    Arthritis Maternal Grandmother    Hyperlipidemia Maternal Grandmother    Stomach cancer Maternal Grandfather    Lung cancer Paternal Grandfather    Social History   Tobacco Use   Smoking status: Never   Smokeless tobacco: Never  Substance Use Topics   Alcohol use: Yes    Comment: socially   Allergies  Allergen Reactions   Tioconazole Swelling    Patient states she had vaginal swelling only; no edema elsewhere.    Amoxicillin-Pot Clavulanate    Escitalopram     "made me angry" "made me angry"   Current Meds  Medication Sig   meloxicam (MOBIC) 7.5 MG tablet Take 1 tablet (7.5 mg total) by mouth 2 (two) times daily as needed for pain.   Norgestimate-Ethinyl Estradiol Triphasic 0.18/0.215/0.25 MG-25 MCG tab Take 1 tablet by mouth daily.   omeprazole (PRILOSEC) 40 MG capsule TAKE 1 CAPSULE (40 MG TOTAL) BY MOUTH  AT BEDTIME. FOR COUGH AND HEARTBURN.   tiZANidine (ZANAFLEX) 4 MG tablet Take by mouth. As needed   Immunization History  Administered Date(s) Administered   PFIZER(Purple Top)SARS-COV-2 Vaccination 01/27/2020, 02/21/2020, 10/10/2020   Tdap 08/22/2019      Objective:   Physical Exam BP 116/78 (BP Location: Left Arm, Patient Position: Sitting, Cuff Size: Normal)    Pulse 92    Temp 98.1 F (36.7 C) (Oral)    Ht 5\' 5"  (1.651 m)    Wt 157 lb 6.4 oz (71.4 kg)    SpO2 95%    BMI 26.19 kg/m  GENERAL: Well-developed, well-nourished young woman, no acute distress, fully ambulatory. HEAD: Normocephalic, atraumatic.  EYES: Pupils equal, round, reactive to light.  No scleral  icterus.  MOUTH: Tightening of the perioral skin. NECK: Supple. No thyromegaly. Trachea midline. No JVD.  No adenopathy. PULMONARY: Good air entry bilaterally.  No adventitious sounds. CARDIOVASCULAR: S1 and S2. Regular rate and rhythm.  No rubs, murmurs or gallops heard. ABDOMEN: Benign. MUSCULOSKELETAL: No joint deformity, apparent early sclerodactyly, mild clubbing the related to sclerodactyly. NEUROLOGIC: No overt focal deficit, no gait disturbance, speech is fluent. SKIN: Intact,warm,dry. There is a flushed appearance to the face and neck PSYCH: Mood and behavior normal  Representative image from CT performed 16 November 2021, independently reviewed:     Assessment & Plan:     ICD-10-CM   1. 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 Persistent cough for 3 weeks or longer  R05.3    Likely related to ILD Currently asymptomatic    4. Gastroesophageal reflux disease, unspecified whether esophagitis present  K21.9    Query related to potential scleroderma (? CREST) Continue omeprazole     Orders Placed This Encounter  Procedures   Hypersensitivity pnuemonitis profile    Standing Status:   Future    Standing Expiration Date:   12/17/2022   ANA Comprehensive Panel    Standing Status:   Future    Number of Occurrences:   1    Standing Expiration Date:   12/17/2022   Sedimentation rate    Standing Status:   Future    Number of Occurrences:   1    Standing Expiration Date:   12/17/2022    C-reactive protein    Standing Status:   Future    Number of Occurrences:   1    Standing Expiration Date:   12/17/2022   Rheumatoid factor    Standing Status:   Future    Number of Occurrences:   1    Standing Expiration Date:   12/17/2022   Pulmonary Function Test [facility] Only    Standing Status:   Future    Number of Occurrences:   1    Standing Expiration Date:   12/17/2022    Order Specific Question:   Full PFT: includes the following: basic spirometry, spirometry pre & post bronchodilator, diffusion capacity (DLCO), lung volumes    Answer:  Full PFT    Order Specific Question:   This test can only be performed at    Answer:   [facility]   The patient's clinical exam is consistent with possible connective tissue disease in particular scleroderma.  Will obtain connective tissue disease work-up.  Referral to rheumatology if positive.  Will obtain PFTs to further characterize lung function given complaint of dyspnea.  If she does indeed have connective tissue disease will obtain echocardiogram to evaluate for potential pulmonary hypertension but will await results of connective tissue disease work-up.  We will see the patient in follow-up in 4 to 6 weeks time she is to contact us prior to that time should any new difficulties arise.  Renold Don, MD Advanced Bronchoscopy [facility]    *This note was dictated using voice recognition software/Dragon.  Despite best efforts to proofread, errors can occur which can change the meaning. Any transcriptional errors that result from this process are unintentional and may not be fully corrected at the time of dictation.

## 2021-12-17 NOTE — Patient Instructions (Signed)
We are checking blood work to start evaluating the process in your lung.  You are getting breathing tests.  We will see you in follow-up in 4 to 6 weeks time call sooner should any new problems arise.  We will keep you posted with the results of your blood work and your breathing tests.

## 2021-12-18 ENCOUNTER — Ambulatory Visit (HOSPITAL_BASED_OUTPATIENT_CLINIC_OR_DEPARTMENT_OTHER): Payer: BC Managed Care – PPO

## 2021-12-18 ENCOUNTER — Other Ambulatory Visit
Admission: RE | Admit: 2021-12-18 | Discharge: 2021-12-18 | Disposition: A | Discharge status: Home / Self Care | Payer: BC Managed Care – PPO | Source: Ambulatory Visit | Attending: Pulmonary Disease | Admitting: Pulmonary Disease

## 2021-12-18 DIAGNOSIS — J849 Interstitial pulmonary disease, unspecified: Secondary | ICD-10-CM

## 2021-12-18 LAB — C-REACTIVE PROTEIN: CRP: 0.6 mg/dL (ref <1.0)

## 2021-12-18 LAB — SEDIMENTATION RATE: Sed Rate: 119 mm/hr — ABNORMAL HIGH (ref 0–20)

## 2021-12-18 MED ORDER — ALBUTEROL SULFATE (2.5 MG/3ML) 0.083% IN NEBU
2.5000 mg | INHALATION_SOLUTION | Freq: Once | RESPIRATORY_TRACT | Status: AC
Start: 2021-12-18 — End: 2021-12-18
  Administered 2021-12-18: 2.5 mg via RESPIRATORY_TRACT
  Filled 2021-12-18: qty 3

## 2021-12-19 ENCOUNTER — Encounter: Payer: Self-pay | Admitting: Pulmonary Disease

## 2021-12-19 LAB — ANA COMPREHENSIVE PANEL
Anti JO-1: <0.2 AI (ref 0.0–0.9)
Centromere Ab Screen: <0.2 AI (ref 0.0–0.9)
Chromatin Ab SerPl-aCnc: <0.2 AI (ref 0.0–0.9)
ENA SM Ab Ser-aCnc: <0.2 AI (ref 0.0–0.9)
Ribonucleic Protein: <0.2 AI (ref 0.0–0.9)
SSA (Ro) (ENA) Antibody, IgG: <0.2 AI (ref 0.0–0.9)
SSB (La) (ENA) Antibody, IgG: <0.2 AI (ref 0.0–0.9)
Scleroderma (Scl-70) (ENA) Antibody, IgG: >8 AI — ABNORMAL HIGH (ref 0.0–0.9)
ds DNA Ab: <1 IU/mL (ref 0–9)

## 2021-12-19 LAB — RHEUMATOID FACTOR: Rheumatoid fact SerPl-aCnc: <10 IU/mL (ref <14.0)

## 2021-12-20 ENCOUNTER — Other Ambulatory Visit: Payer: Self-pay

## 2021-12-20 DIAGNOSIS — M349 Systemic sclerosis, unspecified: Secondary | ICD-10-CM

## 2021-12-23 LAB — HYPERSENSITIVITY PNEUMONITIS
A. Pullulans Abs: NEGATIVE
A.Fumigatus #1 Abs: NEGATIVE
Micropolyspora faeni, IgG: NEGATIVE
Pigeon Serum Abs: NEGATIVE
Thermoact. Saccharii: NEGATIVE
Thermoactinomyces vulgaris, IgG: NEGATIVE

## 2021-12-30 DIAGNOSIS — Z7960 Long term (current) use of unspecified immunomodulators and immunosuppressants: Secondary | ICD-10-CM | POA: Diagnosis not present

## 2021-12-30 DIAGNOSIS — M3481 Systemic sclerosis with lung involvement: Secondary | ICD-10-CM | POA: Diagnosis not present

## 2022-01-20 ENCOUNTER — Other Ambulatory Visit: Payer: Self-pay | Admitting: Primary Care

## 2022-01-20 DIAGNOSIS — R053 Chronic cough: Secondary | ICD-10-CM

## 2022-01-20 DIAGNOSIS — K219 Gastro-esophageal reflux disease without esophagitis: Secondary | ICD-10-CM

## 2022-01-20 MED ORDER — OMEPRAZOLE 20 MG PO CPDR: 20.0000 mg | DELAYED_RELEASE_CAPSULE | Freq: Every day | ORAL | 0 refills | Status: DC

## 2022-01-30 ENCOUNTER — Encounter: Payer: Self-pay | Admitting: Pulmonary Disease

## 2022-01-30 ENCOUNTER — Ambulatory Visit: Payer: BC Managed Care – PPO | Admitting: Pulmonary Disease

## 2022-01-30 VITALS — BP 100/80 | HR 78 | Temp 98.1°F | Ht 65.0 in | Wt 157.2 lb

## 2022-01-30 DIAGNOSIS — R0602 Shortness of breath: Secondary | ICD-10-CM | POA: Diagnosis not present

## 2022-01-30 DIAGNOSIS — R053 Chronic cough: Secondary | ICD-10-CM

## 2022-01-30 DIAGNOSIS — J849 Interstitial pulmonary disease, unspecified: Secondary | ICD-10-CM | POA: Diagnosis not present

## 2022-01-30 DIAGNOSIS — M3481 Systemic sclerosis with lung involvement: Secondary | ICD-10-CM

## 2022-01-30 NOTE — Progress Notes (Signed)
Subjective:    Patient ID: [name], female    DOB: Mar 09, 1986, 36 y.o.   MRN: [id number] Patient Care Team: Pleas Koch, NP as PCP - General (Internal Medicine) Aletha Halim., PA-C (Family Medicine) Carmelia Roller, MD as Referring Physician (Orthopedic Surgery) Avon Gully, NP as Nurse Practitioner (Obstetrics and Gynecology)  Chief Complaint  Patient presents with   Follow-up   HPI [name] is a 36 year old lifelong never smoker who presents for follow-up on the issues of shortness of breath and cough and abnormal findings on CT chest.  She was initially evaluated here on 17 December 2021.  At that time examination and chest CT findings were consistent with possible scleroderma (systemic sclerosis).  Work-up at that time included connective tissue disease work-up.  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  She had a negative hypersensitivity pneumonitis panel.  Sed rate was 119 and scleroderma antibody was very elevated at over 8.  She was then referred to Dr. Posey Pronto at [facility] who valuated her on 30 December 2021 and concurred with the systemic sclerosis diagnosis and has initiated mycophenolate treatment.  Since the patient started mycophenolate she notes that her shortness of breath has improved significantly.  Her cough is still present but also markedly improved.  It is nonproductive and no hemoptysis noted.  She is tolerating the mycophenolate well.  She has had no chest pain, orthopnea or paroxysmal nocturnal dyspnea.  No lower extremity edema no calf tenderness.  Not endorse any other symptomatology.  DATA 12/18/2021 connective tissue work-up: ANA negative, rheumatoid factor negative, sed rate 119, scleroderma >8 (H) 12/18/2021 PFTs: FEV1 2.68 L or 84% predicted, FVC 3.53 L or 91% predicted, FEV1/FVC 76%.  No bronchodilator response.  Lung volumes mildly decreased TLC at 81%, RV at 79%, diffusion capacity mildly reduced at 67%.  Consistent  with diagnosis of ILD   Review of Systems A 10 point review of systems was performed and it is as noted above otherwise negative.  Patient Active Problem List   Diagnosis Date Noted   Systemic sclerosis with lung involvement ([place]) 01/30/2022   ILD (interstitial lung disease) ([place]) 01/30/2022   Persistent cough for 3 weeks or longer 10/18/2021   Acute bronchitis 09/19/2021   Chronic right-sided low back pain 06/25/2020   Seasonal allergies 02/02/2020   Migraines 06/27/2019   Abnormal Pap smear 09/06/2013   Social History   Tobacco Use   Smoking status: Never   Smokeless tobacco: Never  Substance Use Topics   Alcohol use: Yes    Comment: socially   Allergies  Allergen Reactions   Tioconazole Swelling    Patient states she had vaginal swelling only; no edema elsewhere.    Amoxicillin-Pot Clavulanate    Escitalopram     "made me angry" "made me angry"   Current Meds  Medication Sig   meloxicam (MOBIC) 7.5 MG tablet Take 1 tablet (7.5 mg total) by mouth 2 (two) times daily as needed for pain.   mycophenolate (CELLCEPT) 500 MG tablet Take by mouth 2 (two) times daily.   Norgestimate-Ethinyl Estradiol Triphasic 0.18/0.215/0.25 MG-25 MCG tab Take 1 tablet by mouth daily.   omeprazole (PRILOSEC) 20 MG capsule Take 1 capsule (20 mg total) by mouth daily. For heartburn.   tiZANidine (ZANAFLEX) 4 MG tablet Take by mouth. As needed   Immunization History  Administered Date(s) Administered   PFIZER(Purple Top)SARS-COV-2 Vaccination 01/27/2020, 02/21/2020, 10/10/2020   Tdap 08/22/2019      Objective:  Physical Exam BP 100/80 (BP Location: Left Arm, Patient Position: Sitting, Cuff Size: Normal)   Pulse 78   Temp 98.1 F (36.7 C) (Oral)   Ht 5\' 5"  (1.651 m)   Wt 157 lb 3.2 oz (71.3 kg)   SpO2 96%   BMI 26.16 kg/m  GENERAL: Well-developed, well-nourished young woman, no acute distress, fully ambulatory. HEAD: Normocephalic, atraumatic.  EYES: Pupils equal, round, reactive  to light.  No scleral icterus.  MOUTH: Tightening of the perioral skin. NECK: Supple. No thyromegaly. Trachea midline. No JVD.  No adenopathy. PULMONARY: Good air entry bilaterally.  No adventitious sounds. CARDIOVASCULAR: S1 and S2. Regular rate and rhythm.  No rubs, murmurs or gallops heard. ABDOMEN: Benign. MUSCULOSKELETAL: No joint deformity, early sclerodactyly, mild clubbing the related to sclerodactyly. NEUROLOGIC: No overt focal deficit, no gait disturbance, speech is fluent. SKIN: Intact,warm,dry. There is a flushed appearance to the face and neck less pronounced than prior. PSYCH: Mood and behavior normal.  Recent Results (from the past 2160 hour(s))  SARS CORONAVIRUS 2 (TAT 6-24 HRS) Nasopharyngeal Nasopharyngeal Swab     Status: None   Collection Time: 12/17/21 11:17 AM   Specimen: Nasopharyngeal Swab  Result Value Ref Range   SARS Coronavirus 2 NEGATIVE NEGATIVE    Comment: (NOTE) SARS-CoV-2 target nucleic acids are NOT DETECTED.  The SARS-CoV-2 RNA is generally detectable in upper and lower respiratory specimens during the acute phase of infection. Negative results do not preclude SARS-CoV-2 infection, do not rule out co-infections with other pathogens, and should not be used as the sole basis for treatment or other patient management decisions. Negative results must be combined with clinical observations, patient history, and epidemiological information. The expected result is Negative.  Fact Sheet for Patients: SugarRoll.be  Fact Sheet for Healthcare Providers: https://www.woods-mathews.com/  This test is not yet approved or cleared by the [place] FDA and  has been authorized for detection and/or diagnosis of SARS-CoV-2 by FDA under an Emergency Use Authorization (EUA). This EUA will remain  in effect (meaning this test can be used) for the duration of the COVID-19 declaration under Se ction 564(b)(1) of the Act, 21  U.S.C. section 360bbb-3(b)(1), unless the authorization is terminated or revoked sooner.  Performed at [facility], [place] [address] [place], [place] [address]   ANA Comprehensive Panel     Status: Abnormal   Collection Time: 12/18/21  3:31 PM  Result Value Ref Range   ds DNA Ab <1 0 - 9 IU/mL    Comment: (NOTE)                                   Negative      <5                                   Equivocal  5 - 9                                   Positive      >9    Ribonucleic Protein <0.2 0.0 - 0.9 AI   ENA SM Ab Ser-aCnc <0.2 0.0 - 0.9 AI   Scleroderma (Scl-70) (ENA) Antibody, IgG >8.0 (H) 0.0 - 0.9 AI   SSA (Ro) (ENA) Antibody, IgG <0.2 0.0 - 0.9 AI  SSB (La) (ENA) Antibody, IgG <0.2 0.0 - 0.9 AI   Chromatin Ab SerPl-aCnc <0.2 0.0 - 0.9 AI   Anti JO-1 <0.2 0.0 - 0.9 AI   Centromere Ab Screen <0.2 0.0 - 0.9 AI   See below: Comment     Comment: (NOTE) Autoantibody                       Disease Association ------------------------------------------------------------                        Condition                  Frequency ---------------------   ------------------------   --------- Antinuclear Antibody,    SLE, mixed connective Direct (ANA-D)           tissue diseases ---------------------   ------------------------   --------- dsDNA                    SLE                        40 - 60% ---------------------   ------------------------   --------- Chromatin                Drug induced SLE                90%                         SLE                        48 - 97% ---------------------   ------------------------   --------- SSA (Ro)                 SLE                        25 - 35%                         Sjogren's Syndrome         40 - 70%                         Neonatal Lupus                 100% ---------------------   ------------------------   --------- SSB (La)                 SLE                              10%                         Sjogren's  Syndrome              30% ---------------------   -----------------------    --------- Sm (anti-Smith)          SLE                        15 - 30% ---------------------   -----------------------    --------- RNP                      Mixed  Connective Tissue                         Disease                         95% (U1 nRNP,                SLE                        30 - 50% anti-ribonucleoprotein)  Polymyositis and/or                         Dermatomyositis                 20% ---------------------   ------------------------   --------- Scl-70 (antiDNA          Scleroderma (diffuse)      20 - 35% topoisomerase)           Crest                           13% ---------------------   ------------------------   --------- Jo-1                     Polymyositis and/or                         Dermatomyositis            20 - 40% ---------------------   ------------------------   --------- Centromere B             Scleroderma -  Crest                         variant                         80% Performed At: [facility] [place], [place] [id number] Rush Farmer MD [id number]   Rheumatoid factor     Status: None   Collection Time: 12/18/21  3:31 PM  Result Value Ref Range   Rhuematoid fact SerPl-aCnc <10.0 <14.0 IU/mL    Comment: (NOTE) Performed At: [facility] [place], [place] [id number] Rush Farmer MD [id number]   C-reactive protein     Status: None   Collection Time: 12/18/21  3:31 PM  Result Value Ref Range   CRP 0.6 <1.0 mg/dL    Comment: Performed at [facility], [place] [address], [place], [place] [address]  Hypersensitivity Pneumonitis     Status: None   Collection Time: 12/18/21  3:31 PM  Result Value Ref Range   A.Fumigatus #1 Abs Negative Negative   Micropolyspora faeni, IgG Negative Negative   Thermoactinomyces vulgaris, IgG Negative Negative   A. Pullulans Abs Negative Negative   Thermoact. Saccharii Negative  Negative   P[place]on Serum Abs Negative Negative    Comment: (NOTE) Performed At: [facility] [place], [place] [id number] Rush Farmer MD [id number]   Sedimentation rate     Status: Abnormal   Collection Time: 12/18/21  3:31 PM  Result Value Ref Range   Sed Rate 119 (H) 0 - 20 mm/hr    Comment: Performed at [facility], [address] [place], [place] [address]  Assessment & Plan:     ICD-10-CM   1. Systemic sclerosis with lung involvement ([place])  M34.81 ECHOCARDIOGRAM COMPLETE   She now being treated with mycophenolate Notes improvement in shortness of breath and cough Continue follow-up with rheumatology    2. ILD (interstitial lung disease) ([place])  J84.9 CT CHEST WO CONTRAST   NSIP pattern likely related to systemic sclerosis Repeat CT chest June or July timeframe    3. SOB (shortness of breath)  R06.02 ECHOCARDIOGRAM COMPLETE   Related to ILD likely Need to exclude pulmonary hypertension 2D echo ordered    4. Persistent cough for 3 weeks or longer  R05.3    Markedly improved on mycophenolate     Orders Placed This Encounter  Procedures   CT CHEST WO CONTRAST    Standing Status:   Future    Standing Expiration Date:   01/31/2023    Scheduling Instructions:     June or July    Order Specific Question:   Is patient pregnant?    Answer:   No    Order Specific Question:   Preferred imaging location?    Answer:   [facility]   ECHOCARDIOGRAM COMPLETE    Standing Status:   Future    Standing Expiration Date:   08/02/2022    Order Specific Question:   Where should this test be performed    Answer:   [facility]    Order Specific Question:   Perflutren DEFINITY (image enhancing agent) should be administered unless hypersensitivity or allergy exist    Answer:   Administer Perflutren    Order Specific Question:   Reason for exam-Echo    Answer:   SBE I33.9    Order Specific Question:   Other Comments    Answer:    Scleroderma     Because of her diagnosis of scleroderma we will get an echocardiogram to evaluate for potential pulmonary hypertension.  She will have a high-resolution CT scan of the chest repeated around June or July.  We will follow-up after this is done.  Is to contact us prior to that time should any new difficulties arise.  Renold Don, MD Advanced Bronchoscopy PCCM [facility]    *This note was dictated using voice recognition software/Dragon.  Despite best efforts to proofread, errors can occur which can change the meaning. Any transcriptional errors that result from this process are unintentional and may not be fully corrected at the time of dictation.

## 2022-01-30 NOTE — Patient Instructions (Signed)
We are going to get an echocardiogram to evaluate the artery going from the heart to the lungs, sometimes this can get under high pressure with systemic sclerosis so I want to make sure that this is doing well.  We will need to monitor this once a year. ? ?We will repeat a CT scan of the chest around June or July.  I will see you in follow-up after that scan is done. ? ?Call for any questions or concerns with regards to your breathing issues or cough prior to return visit, if needed. ?

## 2022-01-31 ENCOUNTER — Ambulatory Visit (INDEPENDENT_AMBULATORY_CARE_PROVIDER_SITE_OTHER): Payer: BC Managed Care – PPO

## 2022-01-31 DIAGNOSIS — R0602 Shortness of breath: Secondary | ICD-10-CM | POA: Diagnosis not present

## 2022-01-31 DIAGNOSIS — M3481 Systemic sclerosis with lung involvement: Secondary | ICD-10-CM

## 2022-01-31 LAB — ECHOCARDIOGRAM COMPLETE
AR max vel: 3.04 cm2
AV Area VTI: 3.04 cm2
AV Area mean vel: 2.81 cm2
AV Mean grad: 4 mmHg
AV Peak grad: 6.6 mmHg
Ao pk vel: 1.28 m/s
Area-P 1/2: 3.74 cm2
Calc EF: 67.6 %
MV VTI: 2.7 cm2
S' Lateral: 3.5 cm
Single Plane A2C EF: 68.9 %
Single Plane A4C EF: 64 %

## 2022-02-04 ENCOUNTER — Telehealth: Payer: Self-pay | Admitting: Pulmonary Disease

## 2022-02-04 NOTE — Telephone Encounter (Signed)
Emily Saner, MD  ?02/03/2022  2:38 PM EDT   ?  ?No evidence of pulmonary hypertension.  This is good news.  Her echo was normal.  ? ? ?Patient is aware of results and voiced her understanding.  ?Nothing further needed.  ? ?

## 2022-02-10 DIAGNOSIS — Z7960 Long term (current) use of unspecified immunomodulators and immunosuppressants: Secondary | ICD-10-CM | POA: Diagnosis not present

## 2022-02-10 DIAGNOSIS — M3481 Systemic sclerosis with lung involvement: Secondary | ICD-10-CM | POA: Diagnosis not present

## 2022-02-20 DIAGNOSIS — M3481 Systemic sclerosis with lung involvement: Secondary | ICD-10-CM | POA: Diagnosis not present

## 2022-02-20 DIAGNOSIS — R82998 Other abnormal findings in urine: Secondary | ICD-10-CM | POA: Diagnosis not present

## 2022-02-20 DIAGNOSIS — R3129 Other microscopic hematuria: Secondary | ICD-10-CM | POA: Diagnosis not present

## 2022-02-20 DIAGNOSIS — J849 Interstitial pulmonary disease, unspecified: Secondary | ICD-10-CM | POA: Diagnosis not present

## 2022-04-07 DIAGNOSIS — Z6825 Body mass index (BMI) 25.0-25.9, adult: Secondary | ICD-10-CM | POA: Diagnosis not present

## 2022-04-07 DIAGNOSIS — Z01419 Encounter for gynecological examination (general) (routine) without abnormal findings: Secondary | ICD-10-CM | POA: Diagnosis not present

## 2022-04-22 ENCOUNTER — Other Ambulatory Visit: Payer: Self-pay | Admitting: Primary Care

## 2022-04-22 DIAGNOSIS — K219 Gastro-esophageal reflux disease without esophagitis: Secondary | ICD-10-CM

## 2022-05-12 DIAGNOSIS — Z7960 Long term (current) use of unspecified immunomodulators and immunosuppressants: Secondary | ICD-10-CM | POA: Diagnosis not present

## 2022-05-12 DIAGNOSIS — M3481 Systemic sclerosis with lung involvement: Secondary | ICD-10-CM | POA: Diagnosis not present

## 2022-05-19 ENCOUNTER — Ambulatory Visit
Admission: RE | Admit: 2022-05-19 | Discharge: 2022-05-19 | Disposition: A | Discharge status: Home / Self Care | Payer: BC Managed Care – PPO | Source: Ambulatory Visit | Attending: Pulmonary Disease | Admitting: Pulmonary Disease

## 2022-05-19 DIAGNOSIS — R053 Chronic cough: Secondary | ICD-10-CM | POA: Diagnosis not present

## 2022-05-19 DIAGNOSIS — R911 Solitary pulmonary nodule: Secondary | ICD-10-CM | POA: Diagnosis not present

## 2022-05-19 DIAGNOSIS — J849 Interstitial pulmonary disease, unspecified: Secondary | ICD-10-CM | POA: Insufficient documentation

## 2022-05-19 DIAGNOSIS — J479 Bronchiectasis, uncomplicated: Secondary | ICD-10-CM | POA: Diagnosis not present

## 2022-05-26 ENCOUNTER — Ambulatory Visit: Payer: BC Managed Care – PPO | Admitting: Pulmonary Disease

## 2022-05-26 ENCOUNTER — Encounter: Payer: Self-pay | Admitting: Pulmonary Disease

## 2022-05-26 VITALS — BP 126/78 | HR 79 | Temp 98.3°F | Ht 63.0 in | Wt 148.6 lb

## 2022-05-26 DIAGNOSIS — R0602 Shortness of breath: Secondary | ICD-10-CM

## 2022-05-26 DIAGNOSIS — J849 Interstitial pulmonary disease, unspecified: Secondary | ICD-10-CM

## 2022-05-26 DIAGNOSIS — R053 Chronic cough: Secondary | ICD-10-CM | POA: Diagnosis not present

## 2022-05-26 DIAGNOSIS — M3481 Systemic sclerosis with lung involvement: Secondary | ICD-10-CM

## 2022-05-26 DIAGNOSIS — K219 Gastro-esophageal reflux disease without esophagitis: Secondary | ICD-10-CM

## 2022-05-26 MED ORDER — OMEPRAZOLE 40 MG PO CPDR: 40.0000 mg | DELAYED_RELEASE_CAPSULE | Freq: Every day | ORAL | 6 refills | Status: DC

## 2022-05-26 MED ORDER — QVAR REDIHALER 80 MCG/ACT IN AERB: 2.0000 | INHALATION_SPRAY | Freq: Two times a day (BID) | RESPIRATORY_TRACT | 3 refills | Status: DC

## 2022-05-26 NOTE — Patient Instructions (Signed)
We are giving you a trial of an inhaler called Qvar this is 2 puffs twice a day, make sure you rinse your mouth well after you use it.  Hopefully this will help tone down your cough some.  We are ordering PFTs that will be done next month that will be 6 months from your original PFTs.  These are the breathing test that will help [place] determine how your lung function is doing.  I have increased the strength of your omeprazole (Prilosec) this is now 40 mg.  This is once daily.  You can also take Pepcid at bedtime to help with the reflux.  We will see you in follow-up in 4 to 6 weeks time (after the breathing tests have been done) call sooner should any new problems arise.

## 2022-05-26 NOTE — Progress Notes (Signed)
Subjective:    Patient ID: [name], female    DOB: 1986/08/16, 36 y.o.   MRN: [id number] Patient Care Team: Doreene Nest, NP as PCP - General (Internal Medicine) Richmond Campbell., PA-C (Family Medicine) Lou Cal, MD as Referring Physician (Orthopedic Surgery) Lynden Ang, NP as Nurse Practitioner (Obstetrics and Gynecology) Patterson Hammersmith, MD as Consulting Physician (Rheumatology)  Chief Complaint  Patient presents with   Follow-up    CT 05/19/2022-SOB with incline and dry cough.    HPI [name] is a 36 year old lifelong never smoker who presents for follow-up on the issues of shortness of breath and cough and abnormal findings on CT chest.  She was initially evaluated here on 17 December 2021.  At that time examination and chest CT findings were consistent with possible scleroderma (systemic sclerosis).  Work-up at that time included connective tissue disease work-up.  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  She had a negative hypersensitivity pneumonitis panel.  Sed rate was 119 and scleroderma antibody was very elevated at over 8.  She was then referred to Dr. Allena Katz at [facility] who valuated her on 30 December 2021 and concurred with the systemic sclerosis diagnosis and has initiated mycophenolate treatment.  Since the patient started mycophenolate she notes that her shortness of breath has improved significantly.  Her cough initially improved but now has become more frequent.  It is nonproductive and no hemoptysis noted.  She is tolerating the mycophenolate well.  She has had no chest pain, orthopnea or paroxysmal nocturnal dyspnea.  No lower extremity edema no calf tenderness.  Has also noted that she has had worsening gastroesophageal reflux symptoms despite being on omeprazole.  We discussed that with her pattern of pulmonary fibrosis she would benefit from the mycophenolate for management of her systemic sclerosis but also would benefit  from antifibrotics.  She is however reluctant to start any other medications and wants to continue on the mycophenolate alone for now.  We did agree however on reassessing her pulmonary function.  She had a recent CT that did not show any progression of her disease in the lungs.   DATA 11/16/2021 CT chest with contrast: Widespread patchy groundglass opacity throughout both lungs prominent on the lower lobes.  Scattered mild cylindrical bronchiectasis.  Consistent with NSIP. 12/18/2021 connective tissue work-up: ANA negative, rheumatoid factor negative, sed rate 119, scleroderma >8 (H) 12/18/2021 PFTs: FEV1 2.68 L or 84% predicted, FVC 3.53 L or 91% predicted, FEV1/FVC 76%.  No bronchodilator response.  Lung volumes mildly decreased TLC at 81%, RV at 79%, diffusion capacity mildly reduced at 67%.  Consistent with diagnosis of ILD 05/19/2022 CT chest without contrast: Unchanged mild pulmonary fibrosis in a pattern consistent with NSIP no evidence of progression  Review of Systems A 10 point review of systems was performed and it is as noted above otherwise negative.  Patient Active Problem List   Diagnosis Date Noted   Preventative health care 09/18/2022   Systemic sclerosis with lung involvement ([facility]) 01/30/2022   ILD (interstitial lung disease) ([facility]) 01/30/2022   Chronic right-sided low back pain 06/25/2020   Seasonal allergies 02/02/2020   Migraines 06/27/2019   Abnormal Pap smear 09/06/2013   Social History   Tobacco Use   Smoking status: Never   Smokeless tobacco: Never  Substance Use Topics   Alcohol use: Yes    Comment: socially   Allergies  Allergen Reactions   Tioconazole Swelling    Patient states she had  vaginal swelling only; no edema elsewhere.    Amoxicillin-Pot Clavulanate    Escitalopram     "made me angry"   Medications reviewed.  She is on mycophenolate.  Immunization History  Administered Date(s) Administered   PFIZER(Purple Top)SARS-COV-2 Vaccination  12/05/2019, 01/02/2020, 01/27/2020, 02/21/2020, 10/10/2020   Tdap 08/22/2019       Objective:   Physical Exam BP 126/78 (BP Location: Left Arm, Cuff Size: Normal)   Pulse 79   Temp 98.3 F (36.8 C) (Temporal)   Ht 5\' 3"  (1.6 m)   Wt 148 lb 9.6 oz (67.4 kg)   SpO2 97%   BMI 26.32 kg/m  GENERAL: Well-developed, well-nourished young woman, no acute distress, fully ambulatory. HEAD: Normocephalic, atraumatic.  EYES: Pupils equal, round, reactive to light.  No scleral icterus.  MOUTH: Tightening of the perioral skin. NECK: Supple. No thyromegaly. Trachea midline. No JVD.  No adenopathy. PULMONARY: Good air entry bilaterally.  No adventitious sounds. CARDIOVASCULAR: S1 and S2. Regular rate and rhythm.  No rubs, murmurs or gallops heard. ABDOMEN: Benign. MUSCULOSKELETAL: No joint deformity, early sclerodactyly, mild clubbing the related to sclerodactyly. NEUROLOGIC: No overt focal deficit, no gait disturbance, speech is fluent. SKIN: Intact,warm,dry. There is a flushed appearance to the face and neck less pronounced than prior. PSYCH: Mood and behavior normal.    Assessment & Plan:     ICD-10-CM   1. ILD (interstitial lung disease) (H[facility])  J84.9    Related to systemic sclerosis Currently on CellCept per rheumatology    2. SOB (shortness of breath)  R06.02 Pulmonary Function Test [facility] Only   Overall improving Reassess PFTs 6-minute walk    3. Systemic sclerosis with lung involvement ([facility])  M34.81    She is on CellCept as directed by rheumatology Wants to hold off on starting antifibrotics Highly recommend antifibrotics    4. Chronic cough  R05.3    Trial of Qvar 2 puffs twice a day    5. Gastroesophageal reflux disease, unspecified whether esophagitis present  K21.9    Increase strength of omeprazole Pepcid at bedtime This may be adding to her cough issues     Orders Placed This Encounter  Procedures   Pulmonary Function Test [facility] Only    August 2023    Standing  Status:   Future    Number of Occurrences:   1    Standing Expiration Date:   05/27/2023    Order Specific Question:   Full PFT: includes the following: basic spirometry, spirometry pre & post bronchodilator, diffusion capacity (DLCO), lung volumes    Answer:   Full PFT   Meds ordered this encounter  Medications   omeprazole (PRILOSEC) 40 MG capsule    Sig: Take 1 capsule (40 mg total) by mouth daily.    Dispense:  30 capsule    Refill:  6   beclomethasone (QVAR REDIHALER) 80 MCG/ACT inhaler    Sig: Inhale 2 puffs into the lungs 2 (two) times daily.    Dispense:  1 each    Refill:  3   We will see the patient in follow-up in 4 to 6 weeks time she is to contact 05/29/2023 prior to that time should any new difficulties arise.  [place], MD Advanced Bronchoscopy PCCM [facility]    *This note was dictated using voice recognition software/Dragon.  Despite best efforts to proofread, errors can occur which can change the meaning. Any transcriptional errors that result from this process are unintentional and may not be fully  corrected at the time of dictation.

## 2022-05-31 IMAGING — DX DG LUMBAR SPINE 2-3V
3 series · 3 of 3 positions shown · non-contrast
Comparison: None

CLINICAL DATA: Chronic back pain.

EXAM:
LUMBAR SPINE - 2-3 VIEW

[l-spine ap]
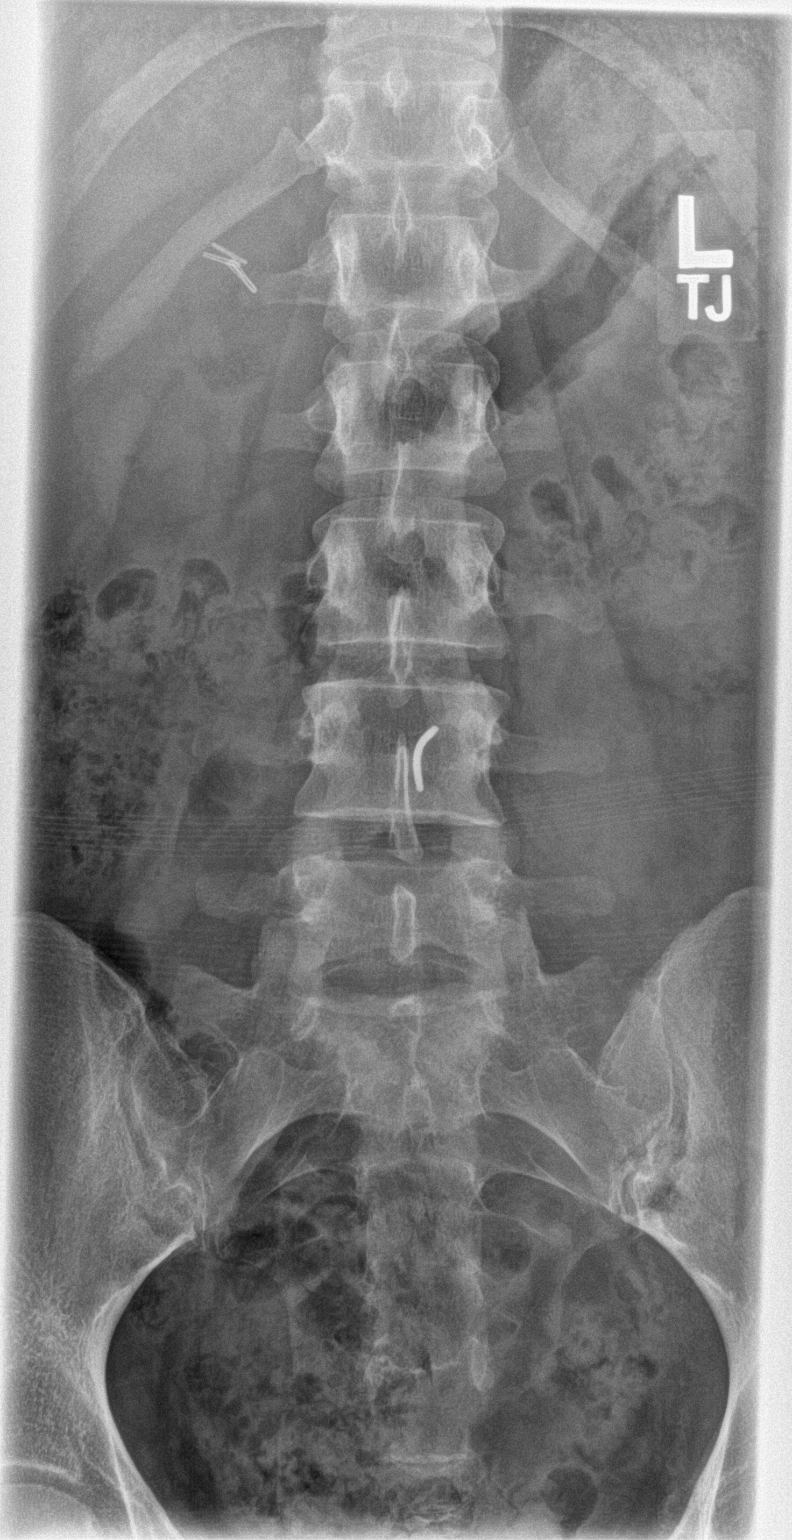

[l-spine lat]
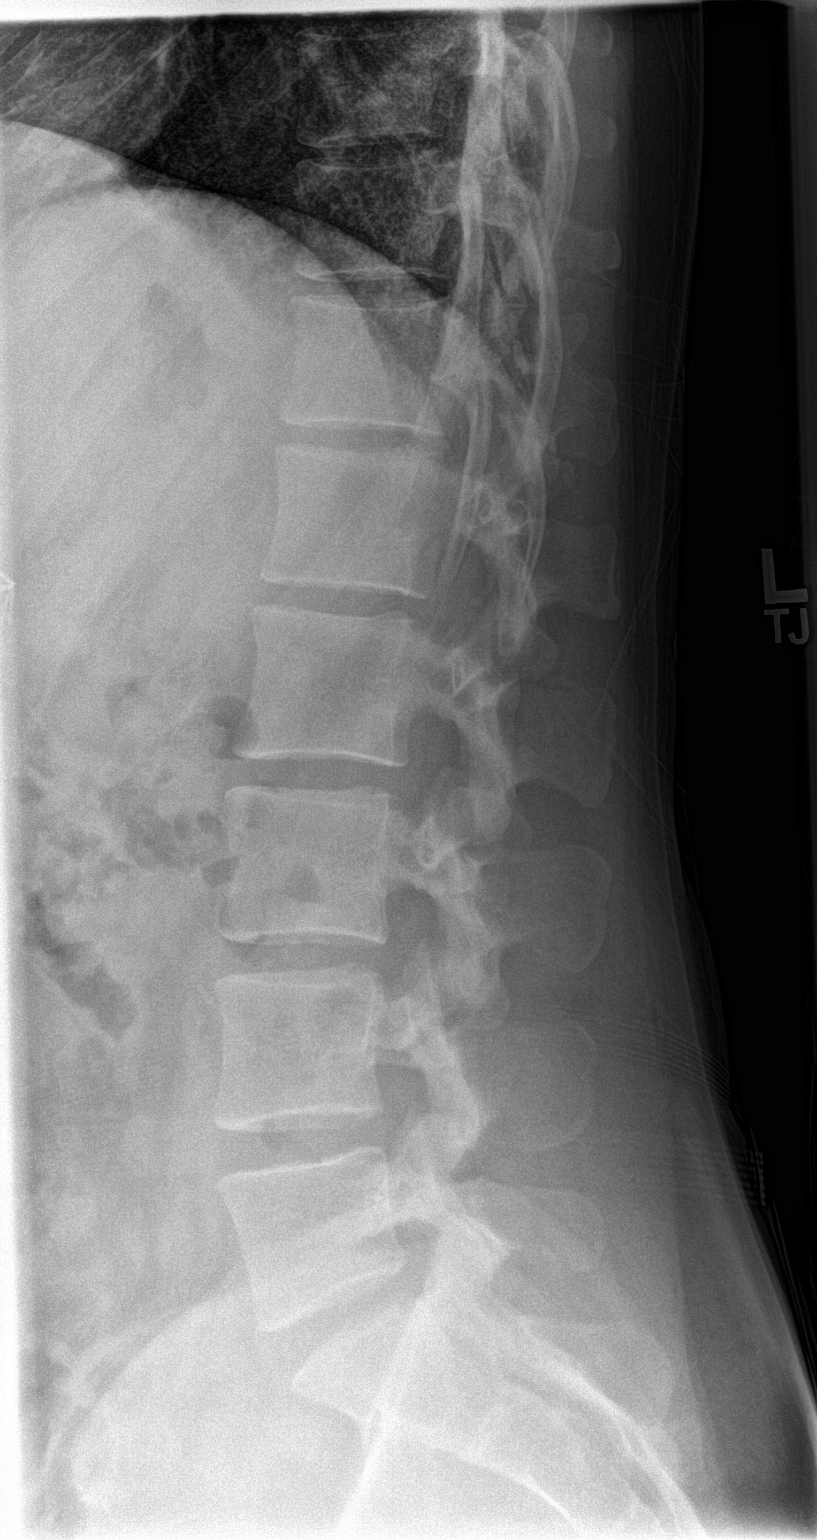

[l-spine l5/s1]
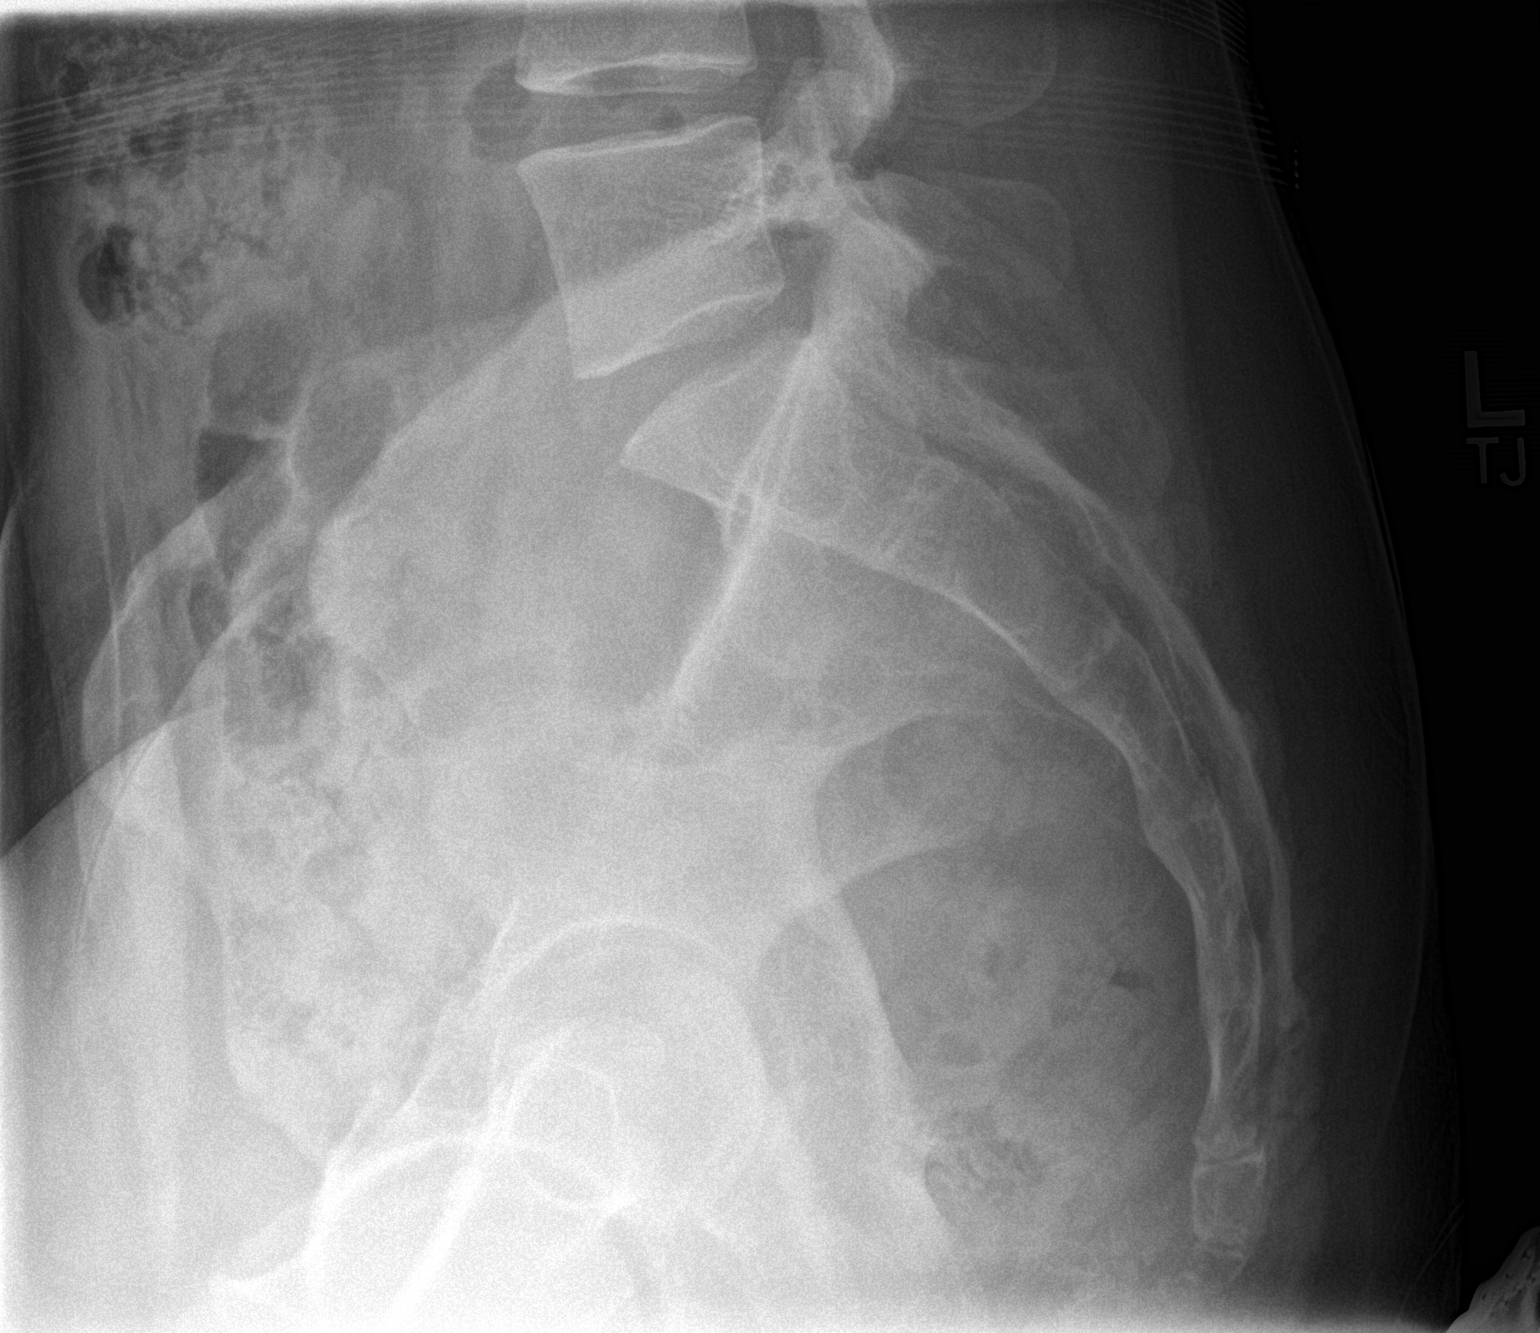

[3 of 3 positions shown; findings below may reference images not displayed]

FINDINGS: There is no evidence of lumbar spine fracture. Alignment is normal.
Intervertebral disc spaces are maintained.
IMPRESSION: Negative.

## 2022-06-12 ENCOUNTER — Ambulatory Visit: Payer: BC Managed Care – PPO | Attending: Pulmonary Disease

## 2022-06-12 DIAGNOSIS — R0602 Shortness of breath: Secondary | ICD-10-CM | POA: Diagnosis not present

## 2022-06-12 DIAGNOSIS — J8410 Pulmonary fibrosis, unspecified: Secondary | ICD-10-CM | POA: Insufficient documentation

## 2022-06-12 LAB — PULMONARY FUNCTION TEST ARMC ONLY
DL/VA % pred: 72 %
DL/VA: 3.25 ml/min/mmHg/L
DLCO unc % pred: 65 %
DLCO unc: 15 ml/min/mmHg
FEF 25-75 Post: 2.8 L/sec
FEF 25-75 Pre: 2.57 L/sec
FEF2575-%Change-Post: 8 %
FEF2575-%Pred-Post: 84 %
FEF2575-%Pred-Pre: 77 %
FEV1-%Change-Post: 4 %
FEV1-%Pred-Post: 86 %
FEV1-%Pred-Pre: 82 %
FEV1-Post: 2.75 L
FEV1-Pre: 2.63 L
FEV1FVC-%Change-Post: 5 %
FEV1FVC-%Pred-Pre: 98 %
FEV6-%Change-Post: -1 %
FEV6-%Pred-Post: 83 %
FEV6-%Pred-Pre: 84 %
FEV6-Post: 3.16 L
FEV6-Pre: 3.2 L
FEV6FVC-%Pred-Post: 101 %
FEV6FVC-%Pred-Pre: 101 %
FVC-%Change-Post: -1 %
FVC-%Pred-Post: 81 %
FVC-%Pred-Pre: 82 %
FVC-Post: 3.16 L
FVC-Pre: 3.2 L
Post FEV1/FVC ratio: 87 %
Post FEV6/FVC ratio: 100 %
Pre FEV1/FVC ratio: 82 %
Pre FEV6/FVC Ratio: 100 %
RV % pred: 123 %
RV: 1.93 L
TLC % pred: 83 %
TLC: 4.33 L

## 2022-06-12 MED ORDER — ALBUTEROL SULFATE (2.5 MG/3ML) 0.083% IN NEBU
2.5000 mg | INHALATION_SOLUTION | Freq: Once | RESPIRATORY_TRACT | Status: AC
  Administered 2022-06-12: 2.5 mg via RESPIRATORY_TRACT

## 2022-06-17 ENCOUNTER — Telehealth: Payer: Self-pay | Admitting: Pulmonary Disease

## 2022-06-17 DIAGNOSIS — J849 Interstitial pulmonary disease, unspecified: Secondary | ICD-10-CM

## 2022-06-17 NOTE — Telephone Encounter (Signed)
Emily Saner, MD  Lajoyce Lauber A, CMA Her breathing test did not show any significant change from prior.  We will continue to monitor these.  She will be due for repeat CT chest July of next year, this should be a high-resolution CT.  Of course, if her symptoms worsen then we will repeat this sooner.    Patient is aware of results and voiced her understanding. CTHR ordered. Nothing further needed.

## 2022-07-09 ENCOUNTER — Encounter: Payer: Self-pay | Admitting: Pulmonary Disease

## 2022-07-09 NOTE — Telephone Encounter (Signed)
Dr. Gonzalez, please advise. Thanks 

## 2022-07-10 MED ORDER — METHYLPREDNISOLONE 4 MG PO TBPK: ORAL_TABLET | ORAL | 0 refills | Status: DC

## 2022-07-10 NOTE — Telephone Encounter (Signed)
Lets send in a Medrol Dosepak.  She is probably reacting to ragweed.

## 2022-07-28 ENCOUNTER — Encounter: Payer: Self-pay | Admitting: Pulmonary Disease

## 2022-07-28 ENCOUNTER — Ambulatory Visit: Payer: BC Managed Care – PPO | Admitting: Pulmonary Disease

## 2022-07-28 VITALS — BP 106/80 | HR 83 | Temp 97.7°F | Ht 65.0 in | Wt 148.8 lb

## 2022-07-28 DIAGNOSIS — J849 Interstitial pulmonary disease, unspecified: Secondary | ICD-10-CM | POA: Diagnosis not present

## 2022-07-28 DIAGNOSIS — M3481 Systemic sclerosis with lung involvement: Secondary | ICD-10-CM

## 2022-07-28 DIAGNOSIS — K219 Gastro-esophageal reflux disease without esophagitis: Secondary | ICD-10-CM | POA: Diagnosis not present

## 2022-07-28 DIAGNOSIS — R053 Chronic cough: Secondary | ICD-10-CM

## 2022-07-28 NOTE — Progress Notes (Signed)
Subjective:    Patient ID: [name], female    DOB: 1986-02-04, 36 y.o.   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  She was first evaluated here on 17 December 2021.  At that time she was evaluated for an abnormal chest CT.  This was consistent with NSIP, examination was consistent with potential scleroderma, patient had work-up done and systemic sclerosis was confirmed.  Patient has been on CellCept since the end of February 2023 after evaluation by rheumatology (Dr. Allena Katz).  She was last seen here on 26 May 2022 at that time she was placed on Qvar 2 puffs twice a day because of the issues with cough.  Trying to avoid prednisone due to the potential for renal crisis.  We ordered PFTs at that time and also increase the dose of omeprazole due to reflux symptoms.  In the interim she developed an issue with worsening cough after she traveled outside of the state upon return to [place] had severe allergic symptoms and had to be treated with a Medrol Dosepak.  Tolerated this well without untoward side effect.  Her cough subsided.  She continues to use her Qvar and is doing well.  Her PFTs have not shown significant change.  Repeat CT chest high-resolution has not shown significant change.  Today we discussed the possibility of adding antifibrotic to her CellCept therapy however she is adamant that she wants to hold off on this until her next CT  chest.  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  No bronchodilator response.  Lung volumes mildly decreased TLC at 81%, RV at 79%, diffusion capacity mildly reduced at 67%.  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  No significant change from study performed 18 December 2021  Review of Systems A 10 point review of systems was performed and it is as noted above otherwise negative.  Patient Active Problem List   Diagnosis Date Noted   Systemic sclerosis with lung involvement (HCC) 01/30/2022   ILD (interstitial lung disease) (HCC) 01/30/2022   Persistent cough for 3 weeks or longer 10/18/2021   Acute bronchitis 09/19/2021  Chronic right-sided low back pain 06/25/2020   Seasonal allergies 02/02/2020   Migraines 06/27/2019   Abnormal Pap smear 09/06/2013   Social History   Tobacco Use   Smoking status: Never   Smokeless tobacco: Never  Substance Use Topics    Alcohol use: Yes    Comment: socially   Allergies  Allergen Reactions   Tioconazole Swelling    Patient states she had vaginal swelling only; no edema elsewhere.    Amoxicillin-Pot Clavulanate    Escitalopram     "made me angry" "made me angry"   Current Meds  Medication Sig   beclomethasone (QVAR REDIHALER) 80 MCG/ACT inhaler Inhale 2 puffs into the lungs 2 (two) times daily.   mycophenolate (CELLCEPT) 500 MG tablet Take by mouth 2 (two) times daily.   Norgestimate-Ethinyl Estradiol Triphasic 0.18/0.215/0.25 MG-25 MCG tab Take 1 tablet by mouth daily.   omeprazole (PRILOSEC) 40 MG capsule Take 1 capsule (40 mg total) by mouth daily.   tiZANidine (ZANAFLEX) 4 MG tablet Take by mouth. As needed   Immunization History  Administered Date(s) Administered   PFIZER(Purple Top)SARS-COV-2 Vaccination 12/05/2019, 01/02/2020, 01/27/2020, 02/21/2020, 10/10/2020   Tdap 08/22/2019      Objective:   Physical Exam BP 106/80 (BP Location: Left Arm, Patient Position: Sitting, Cuff Size: Normal)   Pulse 83   Temp 97.7 F (36.5 C) (Oral)   Ht 5\' 5"  (1.651 m)   Wt 148 lb 12.8 oz (67.5 kg)   SpO2 99%   BMI 24.76 kg/m  GENERAL: Well-developed, well-nourished young woman, no acute distress, fully ambulatory.  No conversational dyspnea.  No throat clearing. HEAD: Normocephalic, atraumatic.  EYES: Pupils equal, round, reactive to light.  No scleral icterus.  MOUTH: Tightening of the perioral skin mild. NECK: Supple. No thyromegaly. Trachea midline. No JVD.  No adenopathy. PULMONARY: Good air entry bilaterally.  No adventitious sounds. CARDIOVASCULAR: S1 and S2. Regular rate and rhythm.  No rubs, murmurs or gallops heard. ABDOMEN: Benign. MUSCULOSKELETAL: No joint deformity, early sclerodactyly, very mild clubbing the related to sclerodactyly. NEUROLOGIC: No overt focal deficit, no gait disturbance, speech is fluent. SKIN: Intact,warm,dry. There is a flushed appearance to the face and neck less  pronounced than prior. PSYCH: Mood and behavior normal.     Assessment & Plan:     ICD-10-CM   1. ILD (interstitial lung disease) (HCC)  J84.9    Related to systemic sclerosis Patient declines antifibroti's at present Continue to monitor PFTs, 6-minute walk + high resolution chest CT    2. Systemic sclerosis with lung involvement (HCC)  M34.81     3. Chronic cough  R05.3    Controlled on Qvar 2 puffs twice a day Continue Qvar    4. Gastroesophageal reflux disease, unspecified whether esophagitis present  K21.9    Controlled on omeprazole Continue antireflux measures Continue omeprazole     Patient appears to be fairly well compensated from her ILD standpoint.  Discussed adding antifibrotics to CellCept as these have been shown to have synergistic effects on ILD related to systemic sclerosis.  Patient however wants to withhold on antifibrotics for now.  We will see the patient in follow-up in 3 months time she is to contact us prior to that time should any new difficulties arise.  Gailen Shelter, MD Advanced Bronchoscopy PCCM [facility]    *This note was dictated using voice recognition software/Dragon.  Despite best efforts to proofread, errors can occur which can change the meaning. Any transcriptional errors that result from  this process are unintentional and may not be fully corrected at the time of dictation.

## 2022-07-28 NOTE — Patient Instructions (Signed)
Continue your Qvar for now 2 puffs twice a day make sure you rinse your mouth well after you use it.  We will see you in follow-up in 3 months time call sooner should any new problems arise.

## 2022-08-15 DIAGNOSIS — M3481 Systemic sclerosis with lung involvement: Secondary | ICD-10-CM | POA: Diagnosis not present

## 2022-08-15 DIAGNOSIS — Z7960 Long term (current) use of unspecified immunomodulators and immunosuppressants: Secondary | ICD-10-CM | POA: Diagnosis not present

## 2022-08-20 ENCOUNTER — Encounter: Payer: Self-pay | Admitting: Pulmonary Disease

## 2022-08-22 NOTE — Telephone Encounter (Signed)
Below I have copied Dr. Domingo Dimes response.  Have a great day.   Try Zyrtec 10 mg at bedtime.  On the Mucinex make sure she uses Mucinex DM she can get the extra strength, take twice a day.  Let us know if she does not improve with that.

## 2022-08-22 NOTE — Telephone Encounter (Signed)
Try Zyrtec 10 mg at bedtime.  On the Mucinex make sure she uses Mucinex DM she can get the extra strength, take twice a day.  Let us know if she does not improve with that.

## 2022-08-22 NOTE — Telephone Encounter (Signed)
Dr. Gonzalez, please advise. Thanks 

## 2022-09-10 ENCOUNTER — Encounter: Payer: Self-pay | Admitting: Pulmonary Disease

## 2022-09-10 ENCOUNTER — Ambulatory Visit: Payer: BC Managed Care – PPO | Admitting: Pulmonary Disease

## 2022-09-10 VITALS — BP 112/78 | HR 78 | Temp 97.7°F | Ht 65.0 in | Wt 147.8 lb

## 2022-09-10 DIAGNOSIS — J302 Other seasonal allergic rhinitis: Secondary | ICD-10-CM | POA: Diagnosis not present

## 2022-09-10 DIAGNOSIS — R0602 Shortness of breath: Secondary | ICD-10-CM | POA: Diagnosis not present

## 2022-09-10 DIAGNOSIS — M3481 Systemic sclerosis with lung involvement: Secondary | ICD-10-CM

## 2022-09-10 DIAGNOSIS — J849 Interstitial pulmonary disease, unspecified: Secondary | ICD-10-CM | POA: Diagnosis not present

## 2022-09-10 DIAGNOSIS — R319 Hematuria, unspecified: Secondary | ICD-10-CM | POA: Diagnosis not present

## 2022-09-10 LAB — NITRIC OXIDE: Nitric Oxide: 7

## 2022-09-10 MED ORDER — FLUCONAZOLE 150 MG PO TABS: 150.0000 mg | ORAL_TABLET | Freq: Every day | ORAL | 0 refills | Status: DC

## 2022-09-10 MED ORDER — AZITHROMYCIN 250 MG PO TABS: ORAL_TABLET | ORAL | 0 refills | Status: AC

## 2022-09-10 NOTE — Patient Instructions (Addendum)
We have sent the prescriptions to your pharmacy.  1 is the azithromycin which we will take for 5 days the other one is for a tablet of fluconazole in case you develop some yeast infection issues.  We will see you as scheduled in December.  Call sooner should any new problems arise.

## 2022-09-10 NOTE — Progress Notes (Signed)
Subjective:    Patient ID: [name], female    DOB: [birth year], 36 y.o.   MRN: [id number] Patient Care Team: Doreene Nest, NP as PCP - General (Internal Medicine) Richmond Campbell., PA-C (Family Medicine) Lou Cal, MD as Referring Physician (Orthopedic Surgery) Lynden Ang, NP as Nurse Practitioner (Obstetrics and Gynecology) Patterson Hammersmith, MD as Consulting Physician (Rheumatology) Salena Saner, MD as Consulting Physician (Pulmonary Disease)  Chief Complaint  Patient presents with   Follow-up    Chronic cough and Hoarseness for 3 weeks. Right side chest pain since Monday. No fevers, chills or sweats.   HPI [name] is a 36 year old lifelong never smoker with systemic sclerosis and lung involvement who presents for an acute visit.  She was last seen here on 28 July 2022 and at that time was doing well.  She has declined use of antifibrotic's currently.  She is on CellCept and has been tolerating this well.  Approximately 3 weeks ago however she developed cough that was dry and nonproductive and she developed issues with hoarseness and "losing her voice".  She states that this happens every fall of the year.  I suspect that this is related to ragweed sensitivity.  She has not had any fevers, chills or sweats but this started to have some right-sided chest discomfort feeling that it was "tight" though this is starting to subside.  No hemoptysis.  No wheezing.  No lower extremity edema or calf tenderness.  She continues to have some mild hoarseness of the voice but this is also improving.  DATA 11/16/2021 CT chest with contrast: Widespread patchy groundglass opacity throughout both lungs most prominent on the lower lobes favoring NSIP, small 4 mm pulmonary nodule anterior aspect of the superior segment of the right lower lobe 12/18/2021 connective tissue work-up: ANA negative, rheumatoid factor negative, sed rate 119, scleroderma >8 (H) 12/18/2021 PFTs: FEV1  2.68 L or 84% predicted, FVC 3.53 L or 91% predicted, FEV1/FVC 76%.  No bronchodilator response.  Lung volumes mildly decreased TLC at 81%, RV at 79%, diffusion capacity mildly reduced at 67%.  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  No significant change from study performed 18 December 2021  Review of Systems A 10 point review of systems was performed and it is as noted above otherwise negative.  Patient Active Problem List   Diagnosis Date Noted   Systemic sclerosis with lung involvement (HCC) 01/30/2022   ILD (interstitial lung disease) (HCC) 01/30/2022   Persistent cough for 3 weeks or longer 10/18/2021   Acute bronchitis 09/19/2021   Chronic right-sided low back pain 06/25/2020   Seasonal allergies 02/02/2020   Migraines 06/27/2019   Abnormal Pap smear 09/06/2013   Social History   Tobacco Use   Smoking status: Never   Smokeless tobacco: Never  Substance Use Topics   Alcohol use: Yes    Comment: socially   Allergies  Allergen Reactions   Tioconazole Swelling    Patient  states she had vaginal swelling only; no edema elsewhere.    Amoxicillin-Pot Clavulanate    Escitalopram     "made me angry"   Current Meds  Medication Sig   beclomethasone (QVAR REDIHALER) 80 MCG/ACT inhaler Inhale 2 puffs into the lungs 2 (two) times daily.    hydroxychloroquine (PLAQUENIL) 200 MG tablet Take 200 mg by mouth daily.   mycophenolate (CELLCEPT) 500 MG tablet Take by mouth 2 (two) times daily.   omeprazole (PRILOSEC) 40 MG capsule Take 1 capsule (40 mg total) by mouth daily.   tiZANidine (ZANAFLEX) 4 MG tablet Take by mouth. As needed   VIENVA 0.1-20 MG-MCG tablet Take 1 tablet by mouth daily.   Immunization History  Administered Date(s) Administered   PFIZER(Purple Top)SARS-COV-2 Vaccination 12/05/2019, 01/02/2020, 01/27/2020, 02/21/2020, 10/10/2020   Tdap 08/22/2019       Objective:   Physical Exam BP 112/78 (BP Location: Left Arm, Cuff Size: Normal)   Pulse 78   Temp 97.7 F (36.5 C)   Ht 5\' 5"  (1.651 m)   Wt 147 lb 12.8 oz (67 kg)   SpO2 97%   BMI 24.60 kg/m  GENERAL: Well-developed, well-nourished young woman, no acute distress, fully ambulatory.  No conversational dyspnea.  No throat clearing, no cough noted. HEAD: Normocephalic, atraumatic.  EYES: Pupils equal, round, reactive to light.  No scleral icterus.  MOUTH: Tightening of the perioral skin mild. NECK: Supple. No thyromegaly. Trachea midline. No JVD.  No adenopathy. PULMONARY: Good air entry bilaterally.  No adventitious sounds. CARDIOVASCULAR: S1 and S2. Regular rate and rhythm.  No rubs, murmurs or gallops heard. ABDOMEN: Benign. MUSCULOSKELETAL: No joint deformity, early sclerodactyly, very mild clubbing the related to sclerodactyly. NEUROLOGIC: No overt focal deficit, no gait disturbance, speech is fluent. SKIN: Intact,warm,dry. There is a flushed appearance to the face and neck less pronounced than prior. PSYCH: Mood and behavior normal.   Lab Results  Component Value Date   NITRICOXIDE 7 09/10/2022  No evidence of type II inflammation.     Assessment & Plan:     ICD-10-CM   1. ILD (interstitial lung disease) (HCC)  J84.9    Mild flare due to seasonal allergies Cannot exclude mild bronchitis Azithromycin    2. Systemic sclerosis with lung  involvement (HCC)  M34.81    Has declined antifibrotics currently Advised CellCept and Ofev have synergistic effect She will reconsider    3. SOB (shortness of breath)  R06.02 Nitric oxide   Increase Qvar to 4 puffs twice a day Has not noted improvement with albuterol previously    4. Seasonal allergies  J30.2    Prone to flares during fall Avoiding steroids due to potential renal crisis precipitation     Orders Placed This Encounter  Procedures   Nitric oxide   Meds ordered this encounter  Medications   azithromycin (ZITHROMAX) 250 MG tablet    Sig: Take 2 tablets (500 mg) on  Day 1,  followed by 1 tablet (250 mg) once daily on Days 2 through 5.    Dispense:  6 each    Refill:  0   fluconazole (DIFLUCAN) 150 MG tablet    Sig: Take 1 tablet (150 mg total) by mouth daily.    Dispense:  1 tablet    Refill:  0   Patient overall does not look acutely ill nor toxic.  Overall feel that this may be a mild flare of ILD due to seasonal allergies.  Cannot exclude acute bronchitis.  Will treat empirically  with azithromycin.  Avoiding steroids due to potential renal crisis in the setting of scleroderma.  We will see the patient as scheduled in December however she is to contact us prior to that time should her symptoms fail to improve or any new difficulties ensue.  She is to reconsider use of Ofev which can help her respiratory symptoms given synergistic effect with CellCept in the case of systemic sclerosis with lung involvement.   Emily Shelter, MD Advanced Bronchoscopy PCCM [facility]    *This note was dictated using voice recognition software/Dragon.  Despite best efforts to proofread, errors can occur which can change the meaning. Any transcriptional errors that result from this process are unintentional and may not be fully corrected at the time of dictation.

## 2022-09-11 ENCOUNTER — Other Ambulatory Visit: Payer: Self-pay | Admitting: Nephrology

## 2022-09-11 DIAGNOSIS — N029 Recurrent and persistent hematuria with unspecified morphologic changes: Secondary | ICD-10-CM

## 2022-09-18 ENCOUNTER — Ambulatory Visit: Payer: BC Managed Care – PPO | Admitting: Primary Care

## 2022-09-18 ENCOUNTER — Ambulatory Visit
Admission: RE | Admit: 2022-09-18 | Discharge: 2022-09-18 | Disposition: A | Discharge status: Home / Self Care | Payer: BC Managed Care – PPO | Source: Ambulatory Visit | Attending: Nephrology | Admitting: Nephrology

## 2022-09-18 ENCOUNTER — Encounter: Payer: Self-pay | Admitting: Primary Care

## 2022-09-18 ENCOUNTER — Ambulatory Visit (INDEPENDENT_AMBULATORY_CARE_PROVIDER_SITE_OTHER): Payer: BC Managed Care – PPO | Admitting: Primary Care

## 2022-09-18 VITALS — BP 118/78 | HR 75 | Temp 98.4°F | Ht 65.0 in | Wt 148.0 lb

## 2022-09-18 DIAGNOSIS — M3481 Systemic sclerosis with lung involvement: Secondary | ICD-10-CM

## 2022-09-18 DIAGNOSIS — R3129 Other microscopic hematuria: Secondary | ICD-10-CM | POA: Diagnosis not present

## 2022-09-18 DIAGNOSIS — Z114 Encounter for screening for human immunodeficiency virus [HIV]: Secondary | ICD-10-CM

## 2022-09-18 DIAGNOSIS — J849 Interstitial pulmonary disease, unspecified: Secondary | ICD-10-CM

## 2022-09-18 DIAGNOSIS — M545 Low back pain, unspecified: Secondary | ICD-10-CM

## 2022-09-18 DIAGNOSIS — Z0000 Encounter for general adult medical examination without abnormal findings: Secondary | ICD-10-CM | POA: Diagnosis not present

## 2022-09-18 DIAGNOSIS — G43009 Migraine without aura, not intractable, without status migrainosus: Secondary | ICD-10-CM

## 2022-09-18 DIAGNOSIS — Z1159 Encounter for screening for other viral diseases: Secondary | ICD-10-CM | POA: Diagnosis not present

## 2022-09-18 DIAGNOSIS — N029 Recurrent and persistent hematuria with unspecified morphologic changes: Secondary | ICD-10-CM | POA: Diagnosis not present

## 2022-09-18 DIAGNOSIS — Z0001 Encounter for general adult medical examination with abnormal findings: Secondary | ICD-10-CM | POA: Insufficient documentation

## 2022-09-18 DIAGNOSIS — G8929 Other chronic pain: Secondary | ICD-10-CM

## 2022-09-18 LAB — LIPID PANEL
Cholesterol: 205 mg/dL — ABNORMAL HIGH (ref 0–200)
HDL: 41.3 mg/dL (ref >39.00)
LDL Cholesterol: 149 mg/dL — ABNORMAL HIGH (ref 0–99)
NonHDL: 164.14
Total CHOL/HDL Ratio: 5
Triglycerides: 76 mg/dL (ref 0.0–149.0)
VLDL: 15.2 mg/dL (ref 0.0–40.0)

## 2022-09-18 LAB — TSH: TSH: 1.19 u[IU]/mL (ref 0.35–5.50)

## 2022-09-18 NOTE — Assessment & Plan Note (Signed)
Stable, occurs with menstrual cycle.  No concerns today. Continue to monitor.

## 2022-09-18 NOTE — Assessment & Plan Note (Signed)
Following with pulmonology. Continue Qvar 80 mcg, 2 puffs BID.

## 2022-09-18 NOTE — Assessment & Plan Note (Signed)
Immunizations UTD. Declines influenza vaccine today. Pap smear UTD.  Discussed the importance of a healthy diet and regular exercise in order for weight loss, and to reduce the risk of further co-morbidity.  Exam stable. Labs pending.  Follow up in 1 year for repeat physical.

## 2022-09-18 NOTE — Progress Notes (Signed)
Subjective:    Patient ID: [name], female    DOB: April 19, 1986, 36 y.o.   MRN: [id number]  HPI  [name] is a very pleasant 36 y.o. female who presents today for complete physical and follow up of chronic conditions.  Immunizations: -Tetanus: 2020 -Influenza: Declines today  Diet: Fair diet.  Exercise: No regular exercise.   Eye exam: Completes annually  Dental exam: Completes semi-annually   Pap Smear: Completed per GYN in May 2023  BP Readings from Last 3 Encounters:  09/18/22 118/78  09/10/22 112/78  07/28/22 106/80       Review of Systems  Constitutional:  Negative for unexpected weight change.  HENT:  Negative for rhinorrhea.   Respiratory:  Positive for cough and shortness of breath.   Cardiovascular:  Negative for chest pain.  Gastrointestinal:  Negative for constipation and diarrhea.  Genitourinary:  Negative for difficulty urinating.  Musculoskeletal:  Positive for arthralgias and back pain. Negative for myalgias.  Skin:  Negative for rash.  Allergic/Immunologic: Negative for environmental allergies.  Neurological:  Negative for dizziness, numbness and headaches.         Past Medical History:  Diagnosis Date   Abdominal pain    Chickenpox    Diarrhea    Fatigue    Migraines    Nausea    Poor appetite    Symptomatic cholelithiasis 06/24/2011   Wears glasses    Weight loss     Social History   Socioeconomic History   Marital status: Married    Spouse name: Not on file   Number of children: Not on file   Years of education: Not on file   Highest education level: Not on file  Occupational History   Not on file  Tobacco Use   Smoking status: Never   Smokeless tobacco: Never  Substance and Sexual Activity   Alcohol use: Yes    Comment: socially   Drug use: Not Currently   Sexual activity: Not on file  Other Topics Concern   Not on file  Social History Narrative   Married.   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        Social Determinants of Health   Financial Resource Strain: Not on file  Food Insecurity: Not on file  Transportation Needs: Not on file  Physical Activity: Not on file  Stress: Not on file  Social Connections: Not on file  Intimate Partner Violence: Not on file    Past Surgical History:  Procedure Laterality Date   CHOLECYSTECTOMY     LAPAROSCOPIC CHOLECYSTECTOMY  06/28/11   WISDOM TOOTH EXTRACTION      Family History  Problem Relation Age of Onset   Cervical cancer Mother    Multiple sclerosis Mother    Arthritis Maternal Grandmother    Hyperlipidemia Maternal Grandmother    Stomach cancer Maternal Grandfather    Lung cancer Paternal Grandfather     Allergies  Allergen Reactions   Tioconazole Swelling    Patient states she had vaginal swelling only; no edema elsewhere.    Amoxicillin-Pot Clavulanate    Escitalopram     "made me angry"    Current Outpatient Medications on File Prior to Visit  Medication Sig Dispense Refill   beclomethasone (QVAR REDIHALER) 80 MCG/ACT inhaler Inhale 2 puffs into the lungs 2 (two) times daily. 1 each 3   hydroxychloroquine (PLAQUENIL) 200 MG tablet Take 200 mg by mouth daily.     mycophenolate (CELLCEPT) 500 MG tablet Take  by mouth 2 (two) times daily.     omeprazole (PRILOSEC) 40 MG capsule Take 1 capsule (40 mg total) by mouth daily. 30 capsule 6   tiZANidine (ZANAFLEX) 4 MG tablet Take by mouth. As needed     VIENVA 0.1-20 MG-MCG tablet Take 1 tablet by mouth daily.     fluconazole (DIFLUCAN) 150 MG tablet Take 1 tablet (150 mg total) by mouth daily. 1 tablet 0   Norgestimate-Ethinyl Estradiol Triphasic 0.18/0.215/0.25 MG-25 MCG tab Take 1 tablet by mouth daily. (Patient not taking: Reported on 09/10/2022)     No current facility-administered medications on file prior to visit.    BP 118/78   Pulse 75   Temp 98.4 F (36.9 C) (Temporal)   Ht 5\' 5"  (1.651 m)   Wt 148 lb (67.1 kg)   SpO2 100%   BMI 24.63 kg/m  Objective:    Physical Exam HENT:     Right Ear: Tympanic membrane and ear canal normal.     Left Ear: Tympanic membrane and ear canal normal.     Nose: Nose normal.  Eyes:     Conjunctiva/sclera: Conjunctivae normal.     Pupils: Pupils are equal, round, and reactive to light.  Neck:     Thyroid: No thyromegaly.  Cardiovascular:     Rate and Rhythm: Normal rate and regular rhythm.     Heart sounds: No murmur heard. Pulmonary:     Effort: Pulmonary effort is normal.     Breath sounds: Normal breath sounds. No rales.  Abdominal:     General: Bowel sounds are normal.     Palpations: Abdomen is soft.     Tenderness: There is no abdominal tenderness.  Musculoskeletal:        General: Normal range of motion.     Cervical back: Neck supple.  Lymphadenopathy:     Cervical: No cervical adenopathy.  Skin:    General: Skin is warm and dry.     Findings: No rash.  Neurological:     Mental Status: She is alert and oriented to person, place, and time.     Cranial Nerves: No cranial nerve deficit.     Deep Tendon Reflexes: Reflexes are normal and symmetric.  Psychiatric:        Mood and Affect: Mood normal.           Assessment & Plan:   Problem List Items Addressed This Visit       Cardiovascular and Mediastinum   Migraines    Stable, occurs with menstrual cycle.  No concerns today. Continue to monitor.         Respiratory   Systemic sclerosis with lung involvement [facility])    Following with rheumatology and pulmonology regularly. Rheumatology notes and labs reviewed from Care Everywhere from October 2023.  Continue Cellcept 500 mg BID, Plaquenil 200 mg daily, Qvar 80 mcg, omeprazole 40 mg daily.   Proceed with renal ultrasound as scheduled.       Relevant Orders   Lipid panel   TSH   ILD (interstitial lung disease) (HCC)    Following with pulmonology. Continue Qvar 80 mcg, 2 puffs BID.        Other   Chronic right-sided low back pain    Stable.  Continue Tizanidine 4  mg PRN for which she uses sparingly.       Preventative health care - Primary    Immunizations UTD. Declines influenza vaccine today. Pap smear UTD.  Discussed the importance of a  healthy diet and regular exercise in order for weight loss, and to reduce the risk of further co-morbidity.  Exam stable. Labs pending.  Follow up in 1 year for repeat physical.       Relevant Orders   Lipid panel   TSH   Other Visit Diagnoses     Screening for HIV (human immunodeficiency virus)       Relevant Orders   HIV antibody (with reflex)   Encounter for hepatitis C screening test for low risk patient       Relevant Orders   Hepatitis C Antibody          Doreene Nest, NP

## 2022-09-18 NOTE — Assessment & Plan Note (Signed)
Stable.  Continue Tizanidine 4 mg PRN for which she uses sparingly.

## 2022-09-18 NOTE — Assessment & Plan Note (Signed)
Following with rheumatology and pulmonology regularly. Rheumatology notes and labs reviewed from Care Everywhere from October 2023.  Continue Cellcept 500 mg BID, Plaquenil 200 mg daily, Qvar 80 mcg, omeprazole 40 mg daily.   Proceed with renal ultrasound as scheduled.

## 2022-09-19 LAB — HEPATITIS C ANTIBODY: Hepatitis C Ab: NONREACTIVE

## 2022-09-19 LAB — HIV ANTIBODY (ROUTINE TESTING W REFLEX): HIV 1&2 Ab, 4th Generation: NONREACTIVE

## 2022-09-28 ENCOUNTER — Other Ambulatory Visit: Payer: Self-pay | Admitting: Pulmonary Disease

## 2022-10-31 ENCOUNTER — Encounter: Payer: Self-pay | Admitting: Pulmonary Disease

## 2022-10-31 ENCOUNTER — Ambulatory Visit (INDEPENDENT_AMBULATORY_CARE_PROVIDER_SITE_OTHER): Payer: BC Managed Care – PPO | Admitting: Pulmonary Disease

## 2022-10-31 VITALS — BP 122/60 | HR 92 | Temp 97.8°F | Ht 65.0 in | Wt 149.2 lb

## 2022-10-31 DIAGNOSIS — M3481 Systemic sclerosis with lung involvement: Secondary | ICD-10-CM | POA: Diagnosis not present

## 2022-10-31 DIAGNOSIS — R0602 Shortness of breath: Secondary | ICD-10-CM

## 2022-10-31 DIAGNOSIS — J849 Interstitial pulmonary disease, unspecified: Secondary | ICD-10-CM | POA: Diagnosis not present

## 2022-10-31 NOTE — Patient Instructions (Signed)
Continue your Qvar as you are doing.   Continue Plaquenil and CellCept as directed by Dr. Allena Katz.  We will see you in follow-up in 4 months time call sooner should you have any new issues.

## 2022-10-31 NOTE — Progress Notes (Signed)
Subjective:    Patient ID: [name], female    DOB: September 25, 1986, 36 y.o.   MRN: [id number] Patient Care Team: Emily Nest, NP as PCP - General (Internal Medicine) [name]., PA-C (Family Medicine) Emily Cal, MD as Referring Physician (Orthopedic Surgery) Emily Ang, NP as Nurse Practitioner (Obstetrics and Gynecology) Emily Hammersmith, MD as Consulting Physician (Rheumatology) Emily Saner, MD as Consulting Physician (Pulmonary Disease)  Chief Complaint  Patient presents with   Follow-up    Breathing is doing well. Occ dry cough.    HPI [name] is a 36 year old lifelong never smoker with systemic sclerosis and lung involvement who presents for a scheduled follow-up visit. She was last seen here on 10 September 2022 and for an acute visit.  She had a mild flare due to seasonal allergies, she was treated empirically with azithromycin as bronchitis could not be totally excluded.  She did well after that.  Since then she has noted that her shortness of breath has resolved.  She has a rare dry cough but this has also improved dramatically.  She has declined and continues to decline use of antifibrotic's. She is on CellCept and Plaquenil has been tolerating these medications well.  She follows with rheumatology, [name].  She has not had any fevers, chills or sweats. No hemoptysis. No wheezing. No lower extremity edema or calf tenderness.  No chest pain.  No orthopnea or paroxysmal nocturnal dyspnea.  Dyspnea on exertion is markedly improved.  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  No bronchodilator response.  Lung volumes mildly decreased TLC at 81%, RV at 79%, diffusion capacity mildly reduced at 67%.  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  No significant change from study performed 18 December 2021  Review of Systems A 10 point review of systems was performed and it is as noted above otherwise negative.  Patient Active Problem List   Diagnosis Date Noted   Preventative health care 09/18/2022   Systemic sclerosis with lung involvement (HCC) 01/30/2022   ILD (interstitial lung disease) (HCC) 01/30/2022   Chronic right-sided low back pain 06/25/2020   Seasonal allergies 02/02/2020   Migraines 06/27/2019   Abnormal Pap smear 09/06/2013   Social History   Tobacco Use   Smoking status: Never   Smokeless tobacco: Never  Substance Use Topics   Alcohol use: Yes    Comment: socially   Allergies  Allergen Reactions   Tioconazole Swelling    Patient states she had vaginal swelling only; no edema elsewhere.    Amoxicillin-Pot Clavulanate    Escitalopram     "  made me angry"   Current Meds  Medication Sig   hydroxychloroquine (PLAQUENIL) 200 MG tablet Take 200 mg by mouth daily.   mycophenolate (CELLCEPT) 500 MG tablet Take by mouth 2 (two) times daily.   omeprazole (PRILOSEC) 40 MG capsule Take 1 capsule (40  mg total) by mouth daily.   QVAR REDIHALER 80 MCG/ACT inhaler INHALE 2 PUFFS INTO THE LUNGS TWICE A DAY   tiZANidine (ZANAFLEX) 4 MG tablet Take by mouth. As needed   VIENVA 0.1-20 MG-MCG tablet Take 1 tablet by mouth daily.   Immunization History  Administered Date(s) Administered   PFIZER(Purple Top)SARS-COV-2 Vaccination 12/05/2019, 01/02/2020, 01/27/2020, 02/21/2020, 10/10/2020   Tdap 08/22/2019      Objective:   Physical Exam BP 122/60 (BP Location: Left Arm, Cuff Size: Normal)   Pulse 92   Temp 97.8 F (36.6 C) (Temporal)   Ht 5\' 5"  (1.651 m)   Wt 149 lb 3.2 oz (67.7 kg)   SpO2 99%   BMI 24.83 kg/m  GENERAL: Well-developed, well-nourished young woman, no acute distress, fully ambulatory.  No conversational dyspnea.  No throat clearing, no cough noted.  Well-appearing. HEAD: Normocephalic, atraumatic.  EYES: Pupils equal, round, reactive to light.  No scleral icterus.  MOUTH: Tightening of the perioral skin is not as pronounced. NECK: Supple. No thyromegaly. Trachea midline. No JVD.  No adenopathy. PULMONARY: Good air entry bilaterally.  No adventitious sounds. CARDIOVASCULAR: S1 and S2. Regular rate and rhythm.  No rubs, murmurs or gallops heard. ABDOMEN: Benign. MUSCULOSKELETAL: No joint deformity, early sclerodactyly, very mild clubbing the related to mild sclerodactyly. NEUROLOGIC: No overt focal deficit, no gait disturbance, speech is fluent. SKIN: Intact,warm,dry.  The previously noted flushed appearance to her face and neck has resolved.   PSYCH: Mood and behavior normal.      Assessment & Plan:     ICD-10-CM   1. ILD (interstitial lung disease) (HCC)  J84.9    She is on therapy for her systemic sclerosis She will have follow-up high risk patient CT chest in July 2024 Will also obtain PFTs at that time    2. Systemic sclerosis with lung involvement (HCC)  M34.81    Follows with Dr. August 2024, rheumatology This issue adds complexity to her management Currently  stable on CellCept and Plaquenil    3. SOB (shortness of breath)  R06.02    Markedly improved to resolved Will do echocardiogram around July Monitoring for pulmonary hypertension in view of systemic sclerosis     C. August, MD Advanced Bronchoscopy P[facility]    *This note was dictated using voice recognition software/Dragon.  Despite best efforts to proofread, errors can occur which can change the meaning. Any transcriptional errors that result from this process are unintentional and may not be fully corrected at the time of dictation.

## 2022-11-07 ENCOUNTER — Encounter: Payer: Self-pay | Admitting: Pulmonary Disease

## 2022-11-07 NOTE — Telephone Encounter (Signed)
Qvar is not longer covered. Advair is a cheaper alternative.    Dr. Patsey Berthold, please advise.

## 2022-11-07 NOTE — Telephone Encounter (Signed)
We can try Advair HFA 115/21, 2 inhalations twice a day, make sure she rinses her mouth well after use.

## 2022-11-10 MED ORDER — FLUTICASONE-SALMETEROL 115-21 MCG/ACT IN AERO: 2.0000 | INHALATION_SPRAY | Freq: Two times a day (BID) | RESPIRATORY_TRACT | 12 refills | Status: DC

## 2022-11-10 NOTE — Addendum Note (Signed)
Addended by: Claudette Head A on: 11/10/2022 08:17 AM   Modules accepted: Orders

## 2022-11-28 ENCOUNTER — Encounter: Payer: Self-pay | Admitting: Pulmonary Disease

## 2022-12-24 ENCOUNTER — Encounter: Payer: Self-pay | Admitting: Primary Care

## 2022-12-24 ENCOUNTER — Ambulatory Visit (INDEPENDENT_AMBULATORY_CARE_PROVIDER_SITE_OTHER): Payer: No Typology Code available for payment source | Admitting: Primary Care

## 2022-12-24 VITALS — BP 108/62 | HR 95 | Temp 99.1°F | Ht 65.0 in | Wt 146.0 lb

## 2022-12-24 DIAGNOSIS — G43009 Migraine without aura, not intractable, without status migrainosus: Secondary | ICD-10-CM | POA: Diagnosis not present

## 2022-12-24 MED ORDER — SUMATRIPTAN SUCCINATE 50 MG PO TABS: ORAL_TABLET | ORAL | 0 refills | Status: AC

## 2022-12-24 MED ORDER — KETOROLAC TROMETHAMINE 60 MG/2ML IM SOLN
60.0000 mg | Freq: Once | INTRAMUSCULAR | Status: AC
  Administered 2022-12-24: 60 mg via INTRAMUSCULAR

## 2022-12-24 NOTE — Assessment & Plan Note (Addendum)
Symptoms suggestive of migraine.   Toradol 60 mg IM given today for acute relief.  Discussed options for treatment. We decided upon an as needed abortive medication. Start Imitrex 50 mg PRN. Take one tablet with migraine onset; may take 2nd tablet two hours after if needed.   She will update if symptoms do not improve or worsen.   I evaluated patient, was consulted regarding treatment, and agree with assessment and plan per Tinnie Gens, RN, DNP student.   Allie Bossier, NP-C

## 2022-12-24 NOTE — Patient Instructions (Signed)
As discussed, you can take Imitrex 50 mg 1 tablet by mouth with migraine onset and then take 2nd tablet two hours later if needed.   Please update if your symptoms worse or not get better.   It was a pleasure to see you today!

## 2022-12-24 NOTE — Progress Notes (Signed)
Subjective:    Patient ID: [name], female    DOB: February 01, 1986, 37 y.o.   MRN: [id number]  Headache  Associated symptoms include rhinorrhea. Pertinent negatives include no fever, nausea or photophobia.    Lisbeth Certo is a very pleasant 37 y.o. female with a history of migraines, systemic sclerosis with lung involvement who presents today to discuss headaches.  Symptom onset was 12/16/22 to the right lower occipital lobe with radiation to right parietal, temporal, and frontal lobes. She denies photophobia, nausea, injury.   She's been taking Tylenol as needed with some improvement. Yesterday she felt much improved until this morning when waking. Her headache is now located to the right parietal region. She's also noticed some right ear pain.   A few weeks ago she developed URI symptoms of rhinorrhea, nasal congestion, headaches, diarrhea, fevers x 3 days. These symptoms resolved.    Review of Systems  Constitutional:  Negative for chills and fever.  HENT:  Positive for rhinorrhea.   Eyes:  Negative for photophobia and visual disturbance.  Respiratory:  Negative for shortness of breath.   Cardiovascular:  Negative for chest pain.  Gastrointestinal:  Negative for nausea.  Neurological:  Positive for headaches.         Past Medical History:  Diagnosis Date   Abdominal pain    Chickenpox    Diarrhea    Fatigue    Migraines    Nausea    Poor appetite    Symptomatic cholelithiasis 06/24/2011   Wears glasses    Weight loss     Social History   Socioeconomic History   Marital status: Married    Spouse name: Not on file   Number of children: Not on file   Years of education: Not on file   Highest education level: Not on file  Occupational History   Not on file  Tobacco Use   Smoking status: Never   Smokeless tobacco: Never  Substance and Sexual Activity   Alcohol use: Yes    Comment: socially   Drug use: Not Currently   Sexual activity: Not on file  Other  Topics Concern   Not on file  Social History Narrative   Married.   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.       Social Determinants of Health   Financial Resource Strain: Not on file  Food Insecurity: Not on file  Transportation Needs: Not on file  Physical Activity: Not on file  Stress: Not on file  Social Connections: Not on file  Intimate Partner Violence: Not on file    Past Surgical History:  Procedure Laterality Date   CHOLECYSTECTOMY     LAPAROSCOPIC CHOLECYSTECTOMY  06/28/11   WISDOM TOOTH EXTRACTION      Family History  Problem Relation Age of Onset   Cervical cancer Mother    Multiple sclerosis Mother    Arthritis Maternal Grandmother    Hyperlipidemia Maternal Grandmother    Stomach cancer Maternal Grandfather    Lung cancer Paternal Grandfather     Allergies  Allergen Reactions   Tioconazole Swelling    Patient states she had vaginal swelling only; no edema elsewhere.    Amoxicillin-Pot Clavulanate    Escitalopram     "made me angry"    Current Outpatient Medications on File Prior to Visit  Medication Sig Dispense Refill   fluticasone-salmeterol (ADVAIR HFA) 115-21 MCG/ACT inhaler Inhale 2 puffs into the lungs 2 (two) times daily. 1 each 12   hydroxychloroquine (  PLAQUENIL) 200 MG tablet Take 200 mg by mouth daily.     mycophenolate (CELLCEPT) 500 MG tablet Take by mouth 2 (two) times daily.     omeprazole (PRILOSEC) 40 MG capsule Take 1 capsule (40 mg total) by mouth daily. 30 capsule 6   tiZANidine (ZANAFLEX) 4 MG tablet Take by mouth. As needed     VIENVA 0.1-20 MG-MCG tablet Take 1 tablet by mouth daily.     No current facility-administered medications on file prior to visit.    BP 108/62   Pulse 95   Temp 99.1 F (37.3 C) (Temporal)   Ht 5' 5"$  (1.651 m)   Wt 146 lb (66.2 kg)   LMP 12/05/2022 (Exact Date)   SpO2 99%   BMI 24.30 kg/m  Objective:   Physical Exam HENT:     Right Ear: Tympanic membrane and ear canal normal.     Left Ear:  Tympanic membrane and ear canal normal.  Eyes:     Extraocular Movements: Extraocular movements intact.  Cardiovascular:     Rate and Rhythm: Normal rate and regular rhythm.  Pulmonary:     Effort: Pulmonary effort is normal.     Breath sounds: Normal breath sounds.  Musculoskeletal:     Cervical back: Neck supple.  Skin:    General: Skin is warm and dry.  Neurological:     Cranial Nerves: No cranial nerve deficit.           Assessment & Plan:  Migraine without aura and without status migrainosus, not intractable Assessment & Plan: Symptoms suggestive of migraine.   Toradol 60 mg IM given today for acute relief.  Discussed options for treatment. We decided upon an as needed abortive medication. Start Imitrex 50 mg PRN. Take one tablet with migraine onset; may take 2nd tablet two hours after if needed.   She will update if symptoms do not improve or worsen.   I evaluated patient, was consulted regarding treatment, and agree with assessment and plan per Tinnie Gens, RN, DNP student.   Allie Bossier, NP-C   Orders: -     SUMAtriptan Succinate; Take 1 tablet by mouth at migraine onset and may repeat in 2 hours if headache persists or recurs.  Dispense: 10 tablet; Refill: 0 -     Ketorolac Tromethamine        Pleas Koch, NP

## 2022-12-24 NOTE — Progress Notes (Signed)
Established Patient Office Visit  Subjective   Patient ID: [name], female    DOB: July 26, 1986  Age: 37 y.o. MRN: [id number]  Chief Complaint  Patient presents with   Headache    Headache onset 02/13 Right now headache is wrapping around from back of head to temples, right ear is aching now.  Beginning of Feb she had what she thought was the Flu, she now has lingering congestion. Tylenol helps dull the pain from headache, but does not completely go away    Headache  Associated symptoms include ear pain, nausea and a sore throat. Pertinent negatives include no blurred vision, coughing, dizziness or fever.    [name] is a 37 year old female with past medical history of migraines, Systemic sclerosis, ILD presents today to discuss headache.   Reports that her symptoms started on 12/16/22. Her headache started on the right lower occipital lobe and then gradually radiated to the frontal right lobe. Today, she noticed that her ear has also hurting. She described her pain as throbbing. She did try ibuprofen for her headache and then she switched to Tylenol. The medication provided her temporary relief. She did not take any medication. She felt the best yesterday.  She denies any chest pain, shortness of breath or blurred vision. She has a history of migraines. She does not get migraine often.   She had some congestion,headache, fever, diarrhea for three days on 12/04/22. She is did not go to the doctor and overall her symptoms have improved exception of cough, nasal congestion, post nasal drainage.   Patient Active Problem List   Diagnosis Date Noted   Preventative health care 09/18/2022   Systemic sclerosis with lung involvement ([place]) 01/30/2022   ILD (interstitial lung disease) ([place]) 01/30/2022   Chronic right-sided low back pain 06/25/2020   Seasonal allergies 02/02/2020   Migraines 06/27/2019   Abnormal Pap smear 09/06/2013   Past Medical History:  Diagnosis Date   Abdominal  pain    Chickenpox    Diarrhea    Fatigue    Migraines    Nausea    Poor appetite    Symptomatic cholelithiasis 06/24/2011   Wears glasses    Weight loss    Past Surgical History:  Procedure Laterality Date   CHOLECYSTECTOMY     LAPAROSCOPIC CHOLECYSTECTOMY  06/28/11   WISDOM TOOTH EXTRACTION     Social History   Tobacco Use   Smoking status: Never   Smokeless tobacco: Never  Substance Use Topics   Alcohol use: Yes    Comment: socially   Drug use: Not Currently   Family History  Problem Relation Age of Onset   Cervical cancer Mother    Multiple sclerosis Mother    Arthritis Maternal Grandmother    Hyperlipidemia Maternal Grandmother    Stomach cancer Maternal Grandfather    Lung cancer Paternal Grandfather    Allergies  Allergen Reactions   Tioconazole Swelling    Patient states she had vaginal swelling only; no edema elsewhere.    Amoxicillin-Pot Clavulanate    Escitalopram     "made me angry"      Review of Systems  Constitutional:  Positive for malaise/fatigue. Negative for chills and fever.  HENT:  Positive for congestion, ear pain and sore throat. Negative for ear discharge and sinus pain.   Eyes:  Negative for blurred vision.  Respiratory:  Negative for cough and shortness of breath.   Cardiovascular:  Negative for chest pain.  Gastrointestinal:  Positive for  nausea.  Neurological:  Positive for headaches. Negative for dizziness.      Objective:     BP 108/62   Pulse 95   Temp 99.1 F (37.3 C) (Temporal)   Ht 5' 5"$  (1.651 m)   Wt 146 lb (66.2 kg)   LMP 12/05/2022 (Exact Date)   SpO2 99%   BMI 24.30 kg/m  BP Readings from Last 3 Encounters:  12/24/22 108/62  10/31/22 122/60  09/18/22 118/78   Wt Readings from Last 3 Encounters:  12/24/22 146 lb (66.2 kg)  10/31/22 149 lb 3.2 oz (67.7 kg)  09/18/22 148 lb (67.1 kg)      Physical Exam Vitals and nursing note reviewed.  HENT:     Right Ear: Tympanic membrane, ear canal and external  ear normal.     Left Ear: Tympanic membrane, ear canal and external ear normal.  Cardiovascular:     Rate and Rhythm: Normal rate and regular rhythm.     Heart sounds: Normal heart sounds.  Pulmonary:     Effort: Pulmonary effort is normal.     Breath sounds: Normal breath sounds.  Neurological:     Mental Status: She is oriented to person, place, and time.  Psychiatric:        Mood and Affect: Mood normal.        Behavior: Behavior normal.      No results found for any visits on 12/24/22.     The ASCVD Risk score (Arnett DK, et al., 2019) failed to calculate for the following reasons:   The 2019 ASCVD risk score is only valid for ages 37 to 18    Assessment & Plan:   Problem List Items Addressed This Visit       Cardiovascular and Mediastinum   Migraines - Primary    Symptoms suggestive of migraine.   Toradol 60 mg IM given today for acute relief.  Discussed options for treatment. We decided upon an as needed abortive medication. Start Imitrex 50 mg PRN. Take one tablet with migraine onset; may take 2nd tablet two hours after if needed.   She will update if symptoms do not improve or worsen.   I evaluated patient, was consulted regarding treatment, and agree with assessment and plan per Tinnie Gens, RN, DNP student.   Allie Bossier, NP-C       Relevant Medications   SUMAtriptan (IMITREX) 50 MG tablet    No follow-ups on file.    Tinnie Gens, BSN-RN, DNP STUDENT

## 2023-02-26 ENCOUNTER — Encounter: Payer: Self-pay | Admitting: Pulmonary Disease

## 2023-02-26 ENCOUNTER — Ambulatory Visit: Payer: No Typology Code available for payment source | Admitting: Pulmonary Disease

## 2023-02-26 VITALS — BP 100/72 | HR 77 | Temp 98.3°F | Ht 65.0 in | Wt 147.2 lb

## 2023-02-26 DIAGNOSIS — R053 Chronic cough: Secondary | ICD-10-CM | POA: Diagnosis not present

## 2023-02-26 DIAGNOSIS — M3481 Systemic sclerosis with lung involvement: Secondary | ICD-10-CM

## 2023-02-26 DIAGNOSIS — K219 Gastro-esophageal reflux disease without esophagitis: Secondary | ICD-10-CM

## 2023-02-26 DIAGNOSIS — J849 Interstitial pulmonary disease, unspecified: Secondary | ICD-10-CM

## 2023-02-26 DIAGNOSIS — R0602 Shortness of breath: Secondary | ICD-10-CM

## 2023-02-26 NOTE — Patient Instructions (Signed)
Continue using your inhaler.  You will be due for a CT chest and breathing test coming up in July and these are being scheduled.  They will call you with the schedule.  We will see you in follow-up in 3 to 4 months time call sooner should any new problems arise.

## 2023-02-26 NOTE — Progress Notes (Signed)
Subjective:    Patient ID: [name], female    DOB: 04-20-1986, 37 y.o.   MRN: [id number] Patient Care Team: Doreene Nest, NP as PCP - General (Internal Medicine) Richmond Campbell., PA-C (Family Medicine) Lou Cal, MD as Referring Physician (Orthopedic Surgery) Lynden Ang, NP as Nurse Practitioner (Obstetrics and Gynecology) Patterson Hammersmith, MD as Consulting Physician (Rheumatology) Salena Saner, MD as Consulting Physician (Pulmonary Disease)  Chief Complaint  Patient presents with   Follow-up    Breathing is better. SOB with exertion. No wheezing or cough.    HPI [name] is a 37 year old very complex lifelong never smoker with systemic sclerosis and lung involvement who presents for a scheduled follow-up visit. She was last seen here on 14 October 2022 as a follow-up of an acute visit.  At that time she was noted to be stable respiratory wise.  She is on Plaquenil and CellCept for her systemic sclerosis and notes that this has helped her shortness of breath as well as joint pains.  Shortness of breath has been markedly improved.  Her chronic cough has for the most part resolved.  We have discussed use of antifibrotics but she has declined until follow-up studies are done.  She follows with rheumatology, Dr. Allena Katz for systemic sclerosis issues.  She has not had any fevers, chills or sweats. No hemoptysis. No wheezing. No lower extremity edema or calf tenderness.  No chest pain.  No orthopnea or paroxysmal nocturnal dyspnea.  Dyspnea on exertion is markedly improved.  She has been noted to have some modestly elevated CPKs that are being followed by Dr. Allena Katz to monitor for potential myositis.  Last CPK on 26 November 2022 was 343 (range 38 - 234 U/L).  Patient not having any myalgias.  She has occasional arthralgias but these have gotten markedly better on the Plaquenil.  She does not endorse any other active complaint today.  Overall she feels well and looks well.    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  No bronchodilator response.  Lung volumes mildly decreased TLC at 81%, RV at 79%, diffusion capacity mildly reduced at 67%.  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  No significant change from study performed 18 December 2021  Review of Systems A 10 point review of systems was performed and it is as noted above otherwise negative.  Patient Active Problem List   Diagnosis Date Noted   Preventative health care 09/18/2022   Systemic sclerosis with lung involvement 01/30/2022   ILD (interstitial lung disease) 01/30/2022   Chronic right-sided low back pain 06/25/2020   Seasonal  allergies 02/02/2020   Migraines 06/27/2019   Abnormal Pap smear 09/06/2013   Social History   Tobacco Use   Smoking status: Never   Smokeless tobacco: Never  Substance Use Topics   Alcohol use: Yes    Comment: socially   Allergies  Allergen  Reactions   Tioconazole Swelling    Patient states she had vaginal swelling only; no edema elsewhere.    Amoxicillin-Pot Clavulanate    Escitalopram     "made me angry"   Current Meds  Medication Sig   fluticasone-salmeterol (ADVAIR HFA) 115-21 MCG/ACT inhaler Inhale 2 puffs into the lungs 2 (two) times daily.   hydroxychloroquine (PLAQUENIL) 200 MG tablet Take 200 mg by mouth daily.   mycophenolate (CELLCEPT) 500 MG tablet Take by mouth 2 (two) times daily.   omeprazole (PRILOSEC) 40 MG capsule Take 1 capsule (40 mg total) by mouth daily.   SUMAtriptan (IMITREX) 50 MG tablet Take 1 tablet by mouth at migraine onset and may repeat in 2 hours if headache persists or recurs.   tiZANidine (ZANAFLEX) 4 MG tablet Take by mouth. As needed   VIENVA 0.1-20 MG-MCG tablet Take 1 tablet by mouth daily.   Immunization History  Administered Date(s) Administered   PFIZER(Purple Top)SARS-COV-2 Vaccination 12/05/2019, 01/02/2020, 01/27/2020, 02/21/2020, 10/10/2020   Tdap 08/22/2019       Objective:   Physical Exam BP 100/72 (BP Location: Left Arm, Cuff Size: Normal)   Pulse 77   Temp 98.3 F (36.8 C)   Ht 5\' 5"  (1.651 m)   Wt 147 lb 3.2 oz (66.8 kg)   SpO2 100%   BMI 24.50 kg/m   SpO2: 100 % O2 Device: None (Room air)  GENERAL: Well-developed, well-nourished young woman, no acute distress, fully ambulatory.  No conversational dyspnea. No throat clearing, no cough noted.  Well-appearing. HEAD: Normocephalic, atraumatic.  EYES: Pupils equal, round, reactive to light.  No scleral icterus.  MOUTH: Tightening of the perioral skin is not as pronounced. NECK: Supple. No thyromegaly. Trachea midline. No JVD.  No adenopathy. PULMONARY: Good air entry bilaterally.  No adventitious sounds. CARDIOVASCULAR: S1 and S2. Regular rate and rhythm.  No rubs, murmurs or gallops heard. ABDOMEN: Benign. MUSCULOSKELETAL: No joint deformity, early sclerodactyly, very mild clubbing the related to mild  sclerodactyly. NEUROLOGIC: No overt focal deficit, no gait disturbance, speech is fluent. SKIN: Intact,warm,dry.  The previously noted flushed appearance to her face and neck has resolved.   PSYCH: Mood and behavior normal.     Assessment & Plan:     ICD-10-CM   1. ILD (interstitial lung disease)  J84.9 Pulmonary Function Test [facility] Only   Related to systemic sclerosis Repeat PFTs and high-res CT in July Continue monitoring    2. Systemic sclerosis with lung involvement  M34.81 Pulmonary Function Test [facility] Only    ECHOCARDIOGRAM COMPLETE   High resolution CT due in July PFTs due in July Continue close monitoring Patient has declined antifibrotics    3. Chronic cough  R05.3 Pulmonary Function Test [facility] Only   For the most part controlled Continue Advair    4. Gastroesophageal reflux disease, unspecified whether esophagitis present  K21.9    Continue PPI    5. SOB (shortness of breath)  R06.02 ECHOCARDIOGRAM COMPLETE   Markedly improved Echocardiogram to monitor for [facility] Elevated CK, continue monitoring     Orders Placed This Encounter  Procedures   Pulmonary Function Test [facility] Only    To be done end of July or early Aug. Before  her follow up with Dr. Jayme Cloud    Standing Status:   Future    Standing Expiration Date:   02/26/2024    Order Specific Question:   Full PFT: includes the following: basic spirometry, spirometry pre & post bronchodilator, diffusion capacity (DLCO), lung volumes    Answer:   Full PFT    Order Specific Question:   This test can only be performed at    Answer:   [facility]   ECHOCARDIOGRAM COMPLETE    To be done end of July or early Aug.    Standing Status:   Future    Standing Expiration Date:   02/26/2024    Order Specific Question:   Where should this test be performed    Answer:   [facility] [place]    Order Specific Question:   Perflutren DEFINITY (image enhancing agent) should be administered unless hypersensitivity or allergy exist     Answer:   Administer Perflutren    Order Specific Question:   Reason for exam-Echo    Answer:   Dyspnea  R06.00   We will see the patient in follow-up in 3 to 4 months time, she will have studies as noted above in the interim.  Will need to continue monitoring CT high-res, PFTs and echocardiogram due to her systemic sclerosis.  She is to contact us prior to follow-up time should any new difficulties arise.  Gailen Shelter, MD Advanced Bronchoscopy PCCM [facility]    *This note was dictated using voice recognition software/Dragon.  Despite best efforts to proofread, errors can occur which can change the meaning. Any transcriptional errors that result from this process are unintentional and may not be fully corrected at the time of dictation.

## 2023-03-03 ENCOUNTER — Telehealth: Payer: Self-pay | Admitting: Pulmonary Disease

## 2023-03-03 NOTE — Telephone Encounter (Signed)
PT calling because a CT and PFT are being ordered for [facility] but her AVS states a Echo has been ordered. Please call PT to advise if this is correct or an input error.  Her # is [phone]

## 2023-03-03 NOTE — Telephone Encounter (Signed)
Per Dr. Georgann Housekeeper last office visit note,  5. SOB (shortness of breath)  R06.02 ECHOCARDIOGRAM COMPLETE    Markedly improved Echocardiogram to monitor for [facility] Elevated CK, continue monitoring     I have notified the patient that Dr. Jayme Cloud did document that she wanted to have the Echo done to evaluate for [facility].  Nothing further needed.

## 2023-03-21 ENCOUNTER — Other Ambulatory Visit: Payer: Self-pay | Admitting: Pulmonary Disease

## 2023-04-13 ENCOUNTER — Encounter: Payer: Self-pay | Admitting: Pulmonary Disease

## 2023-04-15 NOTE — Telephone Encounter (Signed)
I have spoke with [name] and her CT has been scheduled on 05/29/23 @ 9:30am at [facility] and she is aware I will call her back when they give me PFT appts for July

## 2023-05-02 ENCOUNTER — Encounter: Payer: Self-pay | Admitting: Pulmonary Disease

## 2023-05-04 NOTE — Telephone Encounter (Signed)
Yes inhalers can cause the hoarseness, if she is not having many issues with cough right now she can take a break.  Usually this is not an issue if you are rinsing well after the inhaler particularly with a little baking soda in the water.  However if this is still the case she can give a break to the inhaler to see if this will remedy this.  The other possibilities are her scleroderma as it can affect the moisture in the vocal cords.  She can take a break of the inhaler however if her cough gets worse again then we will need to try alternatives.

## 2023-05-08 ENCOUNTER — Encounter: Payer: Self-pay | Admitting: Pulmonary Disease

## 2023-05-14 ENCOUNTER — Other Ambulatory Visit: Payer: Self-pay | Admitting: Pulmonary Disease

## 2023-05-14 DIAGNOSIS — K219 Gastro-esophageal reflux disease without esophagitis: Secondary | ICD-10-CM

## 2023-05-14 DIAGNOSIS — J849 Interstitial pulmonary disease, unspecified: Secondary | ICD-10-CM

## 2023-05-14 DIAGNOSIS — R053 Chronic cough: Secondary | ICD-10-CM

## 2023-05-14 DIAGNOSIS — R0602 Shortness of breath: Secondary | ICD-10-CM

## 2023-05-14 DIAGNOSIS — M3481 Systemic sclerosis with lung involvement: Secondary | ICD-10-CM

## 2023-05-28 ENCOUNTER — Ambulatory Visit: Payer: No Typology Code available for payment source | Attending: Pulmonary Disease

## 2023-05-28 DIAGNOSIS — R0602 Shortness of breath: Secondary | ICD-10-CM | POA: Diagnosis not present

## 2023-05-28 LAB — ECHOCARDIOGRAM COMPLETE
AR max vel: 2.64 cm2
AV Area VTI: 2.7 cm2
AV Area mean vel: 2.51 cm2
AV Mean grad: 3 mmHg
AV Peak grad: 5.3 mmHg
Ao pk vel: 1.15 m/s
Area-P 1/2: 2.99 cm2
Calc EF: 53.3 %
S' Lateral: 3.9 cm
Single Plane A2C EF: 51 %
Single Plane A4C EF: 52.6 %

## 2023-05-29 ENCOUNTER — Ambulatory Visit
Admission: RE | Admit: 2023-05-29 | Discharge: 2023-05-29 | Disposition: A | Discharge status: Home / Self Care | Payer: No Typology Code available for payment source | Source: Ambulatory Visit | Attending: Primary Care

## 2023-05-29 DIAGNOSIS — J849 Interstitial pulmonary disease, unspecified: Secondary | ICD-10-CM | POA: Insufficient documentation

## 2023-06-02 ENCOUNTER — Ambulatory Visit: Payer: No Typology Code available for payment source | Attending: Pulmonary Disease

## 2023-06-02 DIAGNOSIS — R053 Chronic cough: Secondary | ICD-10-CM | POA: Diagnosis not present

## 2023-06-02 DIAGNOSIS — J849 Interstitial pulmonary disease, unspecified: Secondary | ICD-10-CM | POA: Diagnosis present

## 2023-06-02 DIAGNOSIS — F17210 Nicotine dependence, cigarettes, uncomplicated: Secondary | ICD-10-CM | POA: Diagnosis not present

## 2023-06-02 DIAGNOSIS — M3481 Systemic sclerosis with lung involvement: Secondary | ICD-10-CM

## 2023-06-02 LAB — PULMONARY FUNCTION TEST ARMC ONLY
DL/VA % pred: 67 %
DL/VA: 2.99 ml/min/mmHg/L
DLCO unc % pred: 68 %
DLCO unc: 15.56 ml/min/mmHg
FEF 25-75 Post: 1.62 L/sec
FEF 25-75 Pre: 2.3 L/sec
FEF2575-%Change-Post: -29 %
FEF2575-%Pred-Post: 49 %
FEF2575-%Pred-Pre: 69 %
FEV1-%Change-Post: -11 %
FEV1-%Pred-Post: 67 %
FEV1-%Pred-Pre: 76 %
FEV1-Post: 2.14 L
FEV1-Pre: 2.43 L
FEV1FVC-%Change-Post: -8 %
FEV1FVC-%Pred-Pre: 87 %
FEV6-%Change-Post: -3 %
FEV6-%Pred-Post: 84 %
FEV6-%Pred-Pre: 87 %
FEV6-Post: 3.21 L
FEV6-Pre: 3.33 L
FEV6FVC-%Pred-Post: 101 %
FEV6FVC-%Pred-Pre: 101 %
FVC-%Change-Post: -3 %
FVC-%Pred-Post: 83 %
FVC-%Pred-Pre: 86 %
FVC-Post: 3.21 L
FVC-Pre: 3.33 L
Post FEV1/FVC ratio: 67 %
Post FEV6/FVC ratio: 100 %
Pre FEV1/FVC ratio: 73 %
Pre FEV6/FVC Ratio: 100 %
RV % pred: 108 %
RV: 1.71 L
TLC % pred: 85 %
TLC: 4.43 L

## 2023-06-02 MED ORDER — ALBUTEROL SULFATE (2.5 MG/3ML) 0.083% IN NEBU
2.5000 mg | INHALATION_SOLUTION | Freq: Once | RESPIRATORY_TRACT | Status: AC
  Administered 2023-06-02: 2.5 mg via RESPIRATORY_TRACT
  Filled 2023-06-02: qty 3

## 2023-06-04 ENCOUNTER — Encounter: Payer: Self-pay | Admitting: Pulmonary Disease

## 2023-06-04 ENCOUNTER — Ambulatory Visit (INDEPENDENT_AMBULATORY_CARE_PROVIDER_SITE_OTHER): Payer: No Typology Code available for payment source | Admitting: Pulmonary Disease

## 2023-06-04 VITALS — BP 110/70 | HR 75 | Temp 98.1°F | Ht 65.0 in | Wt 150.6 lb

## 2023-06-04 DIAGNOSIS — J683 Other acute and subacute respiratory conditions due to chemicals, gases, fumes and vapors: Secondary | ICD-10-CM

## 2023-06-04 DIAGNOSIS — R0602 Shortness of breath: Secondary | ICD-10-CM

## 2023-06-04 DIAGNOSIS — R053 Chronic cough: Secondary | ICD-10-CM

## 2023-06-04 DIAGNOSIS — M3481 Systemic sclerosis with lung involvement: Secondary | ICD-10-CM | POA: Diagnosis not present

## 2023-06-04 DIAGNOSIS — J849 Interstitial pulmonary disease, unspecified: Secondary | ICD-10-CM

## 2023-06-04 LAB — NITRIC OXIDE: Nitric Oxide: <5

## 2023-06-04 MED ORDER — AIRSUPRA 90-80 MCG/ACT IN AERO: 2.0000 | INHALATION_SPRAY | RESPIRATORY_TRACT | Status: AC | PRN

## 2023-06-04 MED ORDER — AIRSUPRA 90-80 MCG/ACT IN AERO: 2.0000 | INHALATION_SPRAY | RESPIRATORY_TRACT | 3 refills | Status: DC | PRN

## 2023-06-04 NOTE — Progress Notes (Signed)
Subjective:    Patient ID: [name], female    DOB: November 23, 1985, 37 y.o.   MRN: [id number]  Patient Care Team: Doreene Nest, NP as PCP - General (Internal Medicine) Richmond Campbell., PA-C (Family Medicine) Lou Cal, MD as Referring Physician (Orthopedic Surgery) Lynden Ang, NP as Nurse Practitioner (Obstetrics and Gynecology) Patterson Hammersmith, MD as Consulting Physician (Rheumatology) Salena Saner, MD as Consulting Physician (Pulmonary Disease)  Chief Complaint  Patient presents with   Follow-up    No SOB, wheezing or cough.   HPI [name] is a 37 year old very complex lifelong never smoker with systemic sclerosis and lung involvement who presents for a scheduled follow-up visit. She was last seen here on 26 February 2023.  At that time she was noted to be stable respiratory wise.  She is on Plaquenil and CellCept for her systemic sclerosis and notes that this has helped her shortness of breath as well as joint pains.  Shortness of breath has been markedly improved.  Her chronic cough has for the most part resolved.  We have discussed use of antifibrotics.  She would like to reserve these medications only if significant lung function is noted.  She follows with rheumatology, Dr. Allena Katz, for systemic sclerosis issues.  She has not had any fevers, chills or sweats. No hemoptysis. No wheezing. No lower extremity edema or calf tenderness.  No chest pain.  No orthopnea or paroxysmal nocturnal dyspnea.  Dyspnea on exertion is markedly improved.  She has been noted to have some modestly elevated CPKs that are being followed by Dr. Allena Katz to monitor for potential myositis.  Last CPK on 01 Apr 2023 was 331 (range 38 - 234 U/L).  Patient not having any myalgias.  She has occasional arthralgias but these have gotten markedly better on the Plaquenil.  She does not endorse any other active complaint today.  Overall she feels well and looks well.   We discussed her recent studies,  PFTs and high-resolution chest CT.  Overall things appear to be stable.  Her high-resolution chest CT has not been read by radiology as of yet.  He does have mild reactive airways component on PFTs.    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  No bronchodilator response.  Lung volumes mildly decreased TLC at 81%, RV at 79%, diffusion capacity mildly reduced at 67%.  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  No significant change from study performed 18 December 2021 05/29/2023 CT Chest high res: Independent review shows no significant change from prior, however still not read by radiologist at the time of this  visit. 06/02/2023 PFTs: FEV1 2.43 L or 76% predicted, FVC 3.33 L or 86% predicted, lung volumes within normal limits.  Diffusion capacity mildly reduced at 68% no statistical change from prior.  Review of Systems A 10 point review of systems was performed and it is as noted  above otherwise negative.   Patient Active Problem List   Diagnosis Date Noted   Preventative health care 09/18/2022   Systemic sclerosis with lung involvement (HCC) 01/30/2022   ILD (interstitial lung disease) (HCC) 01/30/2022   Chronic cough 10/18/2021   Chronic right-sided low back pain 06/25/2020   Seasonal allergies 02/02/2020   Migraines 06/27/2019   Abnormal Pap smear 09/06/2013    Social History   Tobacco Use   Smoking status: Never   Smokeless tobacco: Never  Substance Use Topics   Alcohol use: Yes    Comment: socially    Allergies  Allergen Reactions   Tioconazole Swelling    Patient states she had vaginal swelling only; no edema elsewhere.    Amoxicillin-Pot Clavulanate    Escitalopram     "made me angry"    Current Meds  Medication Sig   hydroxychloroquine (PLAQUENIL) 200 MG tablet Take 200 mg by mouth daily.   mycophenolate (CELLCEPT) 500 MG tablet Take by mouth 2 (two) times daily.   omeprazole (PRILOSEC) 40 MG capsule TAKE 1 CAPSULE (40 MG TOTAL) BY MOUTH DAILY.   SUMAtriptan (IMITREX) 50 MG tablet Take 1 tablet by mouth at migraine onset and may repeat in 2 hours if headache persists or recurs.   tiZANidine (ZANAFLEX) 4 MG tablet Take by mouth. As needed   VIENVA 0.1-20 MG-MCG tablet Take 1 tablet by mouth daily.    Immunization History  Administered Date(s) Administered   PFIZER(Purple Top)SARS-COV-2 Vaccination 12/05/2019, 01/02/2020, 01/27/2020, 02/21/2020, 10/10/2020   Tdap 08/22/2019      Objective:     BP 110/70 (BP Location: Left Arm, Cuff Size: Normal)   Pulse 75   Temp 98.1 F (36.7 C)   Ht 5\' 5"  (1.651 m)   Wt 150 lb 9.6 oz (68.3 kg)   SpO2 98%   BMI 25.06 kg/m   SpO2: 98 % O2 Device: None (Room air)  GENERAL: Well-developed, well-nourished young woman, no acute distress, fully ambulatory.  No conversational dyspnea. No throat clearing, no cough noted.  Well-appearing. HEAD: Normocephalic, atraumatic.  EYES: Pupils equal,  round, reactive to light.  No scleral icterus.  MOUTH: Tightening of the perioral skin is not as pronounced. NECK: Supple. No thyromegaly. Trachea midline. No JVD.  No adenopathy. PULMONARY: Good air entry bilaterally.  No adventitious sounds. CARDIOVASCULAR: S1 and S2. Regular rate and rhythm.  No rubs, murmurs or gallops heard. ABDOMEN: Benign. MUSCULOSKELETAL: No joint deformity, early sclerodactyly, very mild clubbing. NEUROLOGIC: No overt focal deficit, no gait disturbance, speech is fluent. SKIN: Intact,warm,dry.  The previously noted flushed appearance to her face and neck has resolved.   PSYCH: Mood and behavior normal.  Lab Results  Component Value Date   NITRICOXIDE <5 06/04/2023    Assessment & Plan:     ICD-10-CM   1. 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    Noted on PFTs Trial of AirSupra as needed    3. Systemic sclerosis with lung involvement [facility])  M34.81 Pulmonary Function Test  [facility] Only   Continues therapy for systemic sclerosis On Plaquenil and CellCept Patient has declined antifibrotics    4. SOB (shortness of breath)  R06.02 Nitric oxide    Pulmonary Function Test [facility] Only   Continues to improve    5. Chronic cough  R05.3    For the most part resolved      Orders Placed This Encounter  Procedures   Nitric oxide   Pulmonary Function Test [facility] Only    Standing Status:   Future    Standing Expiration Date:   06/03/2024    Order Specific Question:   Full PFT: includes the following: basic spirometry, spirometry pre & post bronchodilator, diffusion capacity (DLCO), lung volumes    Answer:   Full PFT    Order Specific Question:   6 minute walk    Answer:   Yes    Order Specific Question:   Diffusion capacity (DLCO)    Answer:   Yes    Order  Specific Question:   This test can only be performed at    Answer:   [facility]: [place] [address]    Meds ordered this encounter  Medications   Albuterol-Budesonide (AIRSUPRA) 90-80 MCG/ACT AERO    Sig: Inhale 2 puffs into the lungs every 4 (four) hours as needed.    Order Specific Question:   Lot Number?    Answer:   [id number]    Order Specific Question:   Expiration Date?    Answer:   05/03/2024    Order Specific Question:   Manufacturer?    Answer:   [employer] [[id number]]    Order Specific Question:   Quantity    Answer:   1   Albuterol-Budesonide (AIRSUPRA) 90-80 MCG/ACT AERO    Sig: Inhale 2 puffs into the lungs every 4 (four) hours as needed (cough, shortness of breath).    Dispense:  10.7 g    Refill:  3   Overall doing well.  We are scheduling spirometry, DLCO and 6-minute walk in 6 months.  Echocardiogram and high-res CT chest will be done once yearly.  Will see the patient in follow-up in 6 months time she is to contact us prior to that time should any new difficulties arise.   Gailen Shelter, MD Advanced Bronchoscopy PCCM [facility]    *This note was dictated using voice recognition software/Dragon.  Despite best efforts to proofread, errors can occur which can change the meaning. Any transcriptional errors that result from this process are unintentional and may not be fully corrected at the time of dictation.

## 2023-06-04 NOTE — Patient Instructions (Signed)
We are providing you with an inhaler called AirSupra this is used only as needed.  It is 2 puffs every 4-6 hours as needed for cough or shortness of breath.  Make sure you rinse your mouth well after you use it.  I will let you know the formal port from the radiologist on your CT scan once known.  As we discussed there did not appear to be significant change however it is a different technique from prior.  We are scheduling some abbreviated breathing tests and a 6-minute walk for 6 months, this will help [place] monitor your lung findings.  We will continue to follow your echocardiogram and CT once a year.  We will schedule those closer to that time.  I will see you in follow-up in 6 months time call sooner should any new problems arise.

## 2023-06-08 ENCOUNTER — Telehealth: Payer: Self-pay | Admitting: Pulmonary Disease

## 2023-06-08 NOTE — Telephone Encounter (Signed)
Pt calling back for her Ct results

## 2023-06-08 NOTE — Telephone Encounter (Signed)
Patient is aware of below message and voiced her understanding.  Nothing further needed.   

## 2023-06-08 NOTE — Telephone Encounter (Signed)
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  Is more related to the fibrosis than to true emphysema/COPD.

## 2023-06-08 NOTE — Telephone Encounter (Signed)
Emily Saner, MD  P [facility] Radiologist did not see any worsening of the lung process, good news.   Patient is aware of results and voiced her understanding.  She is concerned about emphysema being mentioned in findings. She would like more information on this.   Dr. Jayme Cloud, please advise. Thanks

## 2023-09-21 ENCOUNTER — Encounter: Payer: Self-pay | Admitting: Pulmonary Disease

## 2023-09-21 ENCOUNTER — Ambulatory Visit
Admission: RE | Admit: 2023-09-21 | Discharge: 2023-09-21 | Disposition: A | Discharge status: Home / Self Care | Payer: No Typology Code available for payment source | Source: Ambulatory Visit | Attending: Pulmonary Disease | Admitting: Pulmonary Disease

## 2023-09-21 ENCOUNTER — Ambulatory Visit (INDEPENDENT_AMBULATORY_CARE_PROVIDER_SITE_OTHER): Payer: No Typology Code available for payment source | Admitting: Pulmonary Disease

## 2023-09-21 VITALS — BP 122/80 | HR 94 | Temp 98.2°F | Ht 65.0 in | Wt 151.0 lb

## 2023-09-21 DIAGNOSIS — J849 Interstitial pulmonary disease, unspecified: Secondary | ICD-10-CM | POA: Diagnosis not present

## 2023-09-21 DIAGNOSIS — M3481 Systemic sclerosis with lung involvement: Secondary | ICD-10-CM

## 2023-09-21 DIAGNOSIS — R0781 Pleurodynia: Secondary | ICD-10-CM | POA: Diagnosis present

## 2023-09-21 MED ORDER — NAPROXEN SODIUM 550 MG PO TABS: ORAL_TABLET | ORAL | 1 refills | Status: AC

## 2023-09-21 MED ORDER — AZITHROMYCIN 250 MG PO TABS: ORAL_TABLET | ORAL | 0 refills | Status: AC

## 2023-09-21 MED ORDER — FLUCONAZOLE 150 MG PO TABS: 150.0000 mg | ORAL_TABLET | Freq: Every day | ORAL | 0 refills | Status: DC

## 2023-09-21 NOTE — Progress Notes (Signed)
Subjective:    Patient ID: [name], female    DOB: 09-Dec-1985, 37 y.o.   MRN: [id number]  Patient Care Team: Doreene Nest, NP as PCP - General (Internal Medicine) Richmond Campbell., PA-C (Family Medicine) Lou Cal, MD as Referring Physician (Orthopedic Surgery) Lynden Ang, NP as Nurse Practitioner (Obstetrics and Gynecology) Patterson Hammersmith, MD as Consulting Physician (Rheumatology) Salena Saner, MD as Consulting Physician (Pulmonary Disease)  Chief Complaint  Patient presents with   Acute Visit    Pain with breathing in. Started on Saturday. Worse on Sunday.     BACKGROUND/INTERVAL:[name] is a 37 year old very complex lifelong never smoker with systemic sclerosis and lung involvement who presents for an ACUTE visit for the issue of pleuritic back pain.  He was last seen on 04 June 2023.  HPI Discussed the use of AI scribe software for clinical note transcription with the patient, who gave verbal consent to proceed.  History of Present Illness   The patient, with a history of scleroderma with lung involvement (NSIP), presents with acute onset of severe mid-thoracic back pain. The pain, described as a 'shooting' sensation, is exacerbated by deep inhalation. The discomfort began after a recent increase in physical activity, including cardio and walking, leading the patient to initially suspect a pulled muscle. However, the pain became so severe over the course of two days that it led to crying and inability to find a comfortable position. The patient found some relief with a heating pad and muscle relaxers from a previous back injury. The pain has since become more tolerable, but the patient remains concerned due to the associated dyspnea. The patient denies any recent respiratory symptoms, fevers, chills, or urinary symptoms. There was some associated nausea and decreased appetite, which the patient attributes to the pain.     DATA 11/16/2021 CT chest  with contrast: Widespread patchy groundglass opacity throughout both lungs most prominent on the lower lobes favoring NSIP, small 4 mm pulmonary nodule anterior aspect of the superior segment of the right lower lobe 12/18/2021 connective tissue work-up: ANA negative, rheumatoid factor negative, sed rate 119, scleroderma >8 (H) 12/18/2021 PFTs: FEV1 2.68 L or 84% predicted, FVC 3.53 L or 91% predicted, FEV1/FVC 76%.  No bronchodilator response.  Lung volumes mildly decreased TLC at 81%, RV at 79%, diffusion capacity mildly reduced at 67%.  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  No significant change from study performed 18 December 2021 05/29/2023 CT Chest high res: Independent review shows no significant change from prior, however still not read by radiologist at the time of this visit. 06/02/2023 PFTs: FEV1 2.43 L or 76% predicted, FVC 3.33 L or 86% predicted, lung volumes within normal limits.  Diffusion capacity mildly reduced at 68% no statistical change from prior.  Review of Systems A 10 point review of systems was performed and it is as noted above otherwise negative.   Patient Active Problem List   Diagnosis Date Noted  Preventative health care 09/18/2022   Systemic sclerosis with lung involvement (HCC) 01/30/2022   ILD (interstitial lung disease) (HCC)  01/30/2022   Chronic cough 10/18/2021   Chronic right-sided low back pain 06/25/2020   Seasonal allergies 02/02/2020   Migraines 06/27/2019   Abnormal Pap smear 09/06/2013    Social History   Tobacco Use   Smoking status: Never   Smokeless tobacco: Never  Substance Use Topics   Alcohol use: Yes    Comment: socially    Allergies  Allergen Reactions   Tioconazole Swelling    Patient states she had vaginal swelling only; no edema elsewhere.    Amoxicillin-Pot Clavulanate    Escitalopram     "made me angry"    Current Meds  Medication Sig   Albuterol-Budesonide (AIRSUPRA) 90-80 MCG/ACT AERO Inhale 2 puffs into the lungs every 4 (four) hours as needed.   hydroxychloroquine (PLAQUENIL) 200 MG tablet Take 200 mg by mouth daily.   mycophenolate (CELLCEPT) 500 MG tablet Take by mouth 2 (two) times daily.   naproxen sodium (ANAPROX DS) 550 MG tablet 2 times daily with meals as needed for mild to moderate pain.   omeprazole (PRILOSEC) 40 MG capsule TAKE 1 CAPSULE (40 MG TOTAL) BY MOUTH DAILY.   SUMAtriptan (IMITREX) 50 MG tablet Take 1 tablet by mouth at migraine onset and may repeat in 2 hours if headache persists or recurs.   tiZANidine (ZANAFLEX) 4 MG tablet Take by mouth. As needed   VIENVA 0.1-20 MG-MCG tablet Take 1 tablet by mouth daily.    Immunization History  Administered Date(s) Administered   PFIZER(Purple Top)SARS-COV-2 Vaccination 12/05/2019, 01/02/2020, 01/27/2020, 02/21/2020, 10/10/2020   Tdap 08/22/2019        Objective:     BP 122/80 (BP Location: Right Arm, Cuff Size: Normal)   Pulse 94   Temp 98.2 F (36.8 C)   Ht 5\' 5"  (1.651 m)   Wt 151 lb (68.5 kg)   SpO2 99%   BMI 25.13 kg/m   SpO2: 99 % O2 Device: None (Room air)  GENERAL: Well-developed, well-nourished young woman, no acute distress, fully ambulatory.  No conversational dyspnea. No throat clearing, no cough noted.  Well-appearing. HEAD: Normocephalic, atraumatic.  EYES: Pupils equal,  round, reactive to light.  No scleral icterus.  MOUTH: Tightening of the perioral skin is not as pronounced. NECK: Supple. No thyromegaly. Trachea midline. No JVD.  No adenopathy. PULMONARY: Good air entry bilaterally.  No adventitious sounds. CARDIOVASCULAR: S1 and S2. Regular rate and rhythm.  No rubs, murmurs or gallops heard. ABDOMEN: Benign. MUSCULOSKELETAL: No joint deformity, early sclerodactyly, very mild clubbing. NEUROLOGIC: No overt focal deficit, no gait disturbance, speech is fluent. SKIN: Intact,warm,dry.  The previously noted flushed appearance to her face and neck has resolved.   PSYCH: Mood and behavior normal.  Assessment & Plan:     ICD-10-CM   1. Pleuritic chest pain  R07.81 DG Chest 2 View    2. Systemic sclerosis with lung involvement (HCC)  M34.81     3. ILD (interstitial lung disease) (HCC)  J84.9       Orders Placed This Encounter  Procedures   DG Chest 2 View    Standing Status:   Future    Number of Occurrences:   1    Standing Expiration Date:   09/20/2024    Order Specific Question:   Reason for Exam (SYMPTOM  OR DIAGNOSIS REQUIRED)    Answer:   PleuriticChest pain/ shortness of breath  Order Specific Question:   Is patient pregnant?    Answer:   No    Order Specific Question:   Preferred imaging location?    Answer:   [facility]    Meds ordered this encounter  Medications   naproxen sodium (ANAPROX DS) 550 MG tablet    Sig: 2 times daily with meals as needed for mild to moderate pain.    Dispense:  20 tablet    Refill:  1   Discussion:    Pleuritic Chest Pain Acute onset of right-sided mid-thoracic pleuritic chest pain, exacerbated by deep breathing. Differential diagnosis includes muscular strain and pleurisy, possibly secondary to a viral infection. No associated fever, chills, or respiratory symptoms. Pain improved with muscle relaxers and heating pad. Discussed potential risks of anti-inflammatory medication, including kidney  function impact, and the need for chest x-ray to rule out pneumonia. Recent nephrology follow-up indicates well-managed kidney function. - Order chest x-ray - Recommend short course of NSAIDs (Anaprox DS) pending kidney function confirmation - Advise use of heating pad as needed - Allow use of Zanaflex (muscle relaxer) as needed - Follow-up in 3-4 weeks or sooner if symptoms persist  Scleroderma Scleroderma with recent nephrology follow-up indicating well-managed kidney function. No new symptoms reported. Coordination of care with rheumatologist and nephrologist discussed. - Monitor for new symptoms or complications - Coordinate care with rheumatologist and nephrologist as needed  General Health Maintenance No specific health maintenance issues discussed. - No additional health maintenance actions required at this time  Follow-up - Schedule follow-up appointment in 3-4 weeks - Advise to contact office if symptoms do not improve or worsen.     Gailen Shelter, MD Advanced [facility]choscopy [facility]    *This note was generated using voice recognition software/Dragon and/or AI transcription program.  Despite best efforts to proofread, errors can occur which can change the meaning. Any transcriptional errors that result from this process are unintentional and may not be fully corrected at the time of dictation.    Addendum: X-ray reviewed, there may be a noticed only on lateral views.  Patient may have "walking pneumonia".  Will proceed with treating with Azithromycin Z pack.  Start 1 tablet of fluconazole given that she is prone to vulvovaginal candidiasis with antibiotics.  Prescriptions sent to the pharmacy.  Gailen Shelter, MD Advanced [facility]choscopy [facility]  [facility]

## 2023-09-21 NOTE — Patient Instructions (Signed)
VISIT SUMMARY:  During today's visit, we discussed your recent onset of severe mid-thoracic back pain, which you described as a 'shooting' sensation that worsens with deep breathing. We also reviewed your history of scleroderma and recent nephrology follow-up. We have developed a plan to address your current symptoms and ensure your overall health is maintained.  YOUR PLAN:  -PLEURITIC CHEST PAIN: Pleuritic chest pain is pain that occurs with breathing and can be caused by inflammation of the tissues around the lungs or a muscle strain. We will order a chest x-ray to rule out pneumonia and recommend a short course of Anaprox DS, pending confirmation of your kidney function. You can continue using a heating pad and Zanaflex (a muscle relaxer) as needed. Please follow up in 3-4 weeks or sooner if your symptoms persist.  -SCLERODERMA: Scleroderma is a chronic connective tissue disease. Your recent nephrology follow-up indicates that your kidney function is well-managed. We will continue to monitor for any new symptoms or complications and coordinate your care with your rheumatologist and nephrologist as needed.  -GENERAL HEALTH MAINTENANCE: No specific health maintenance issues were discussed today. Continue with your current health regimen and no additional actions are required at this time.  INSTRUCTIONS:  Please schedule a follow-up appointment in 3-4 weeks. Contact our office if your symptoms do not improve or worsen.

## 2023-09-23 IMAGING — DX DG CHEST 2V
2 series · 2 of 2 positions shown · non-contrast
Comparison: 09/18/2018

CLINICAL DATA: Initial visit for persistent cough x 2 months. Hx of
bronchitis

EXAM:
CHEST - 2 VIEW

[chest pa]
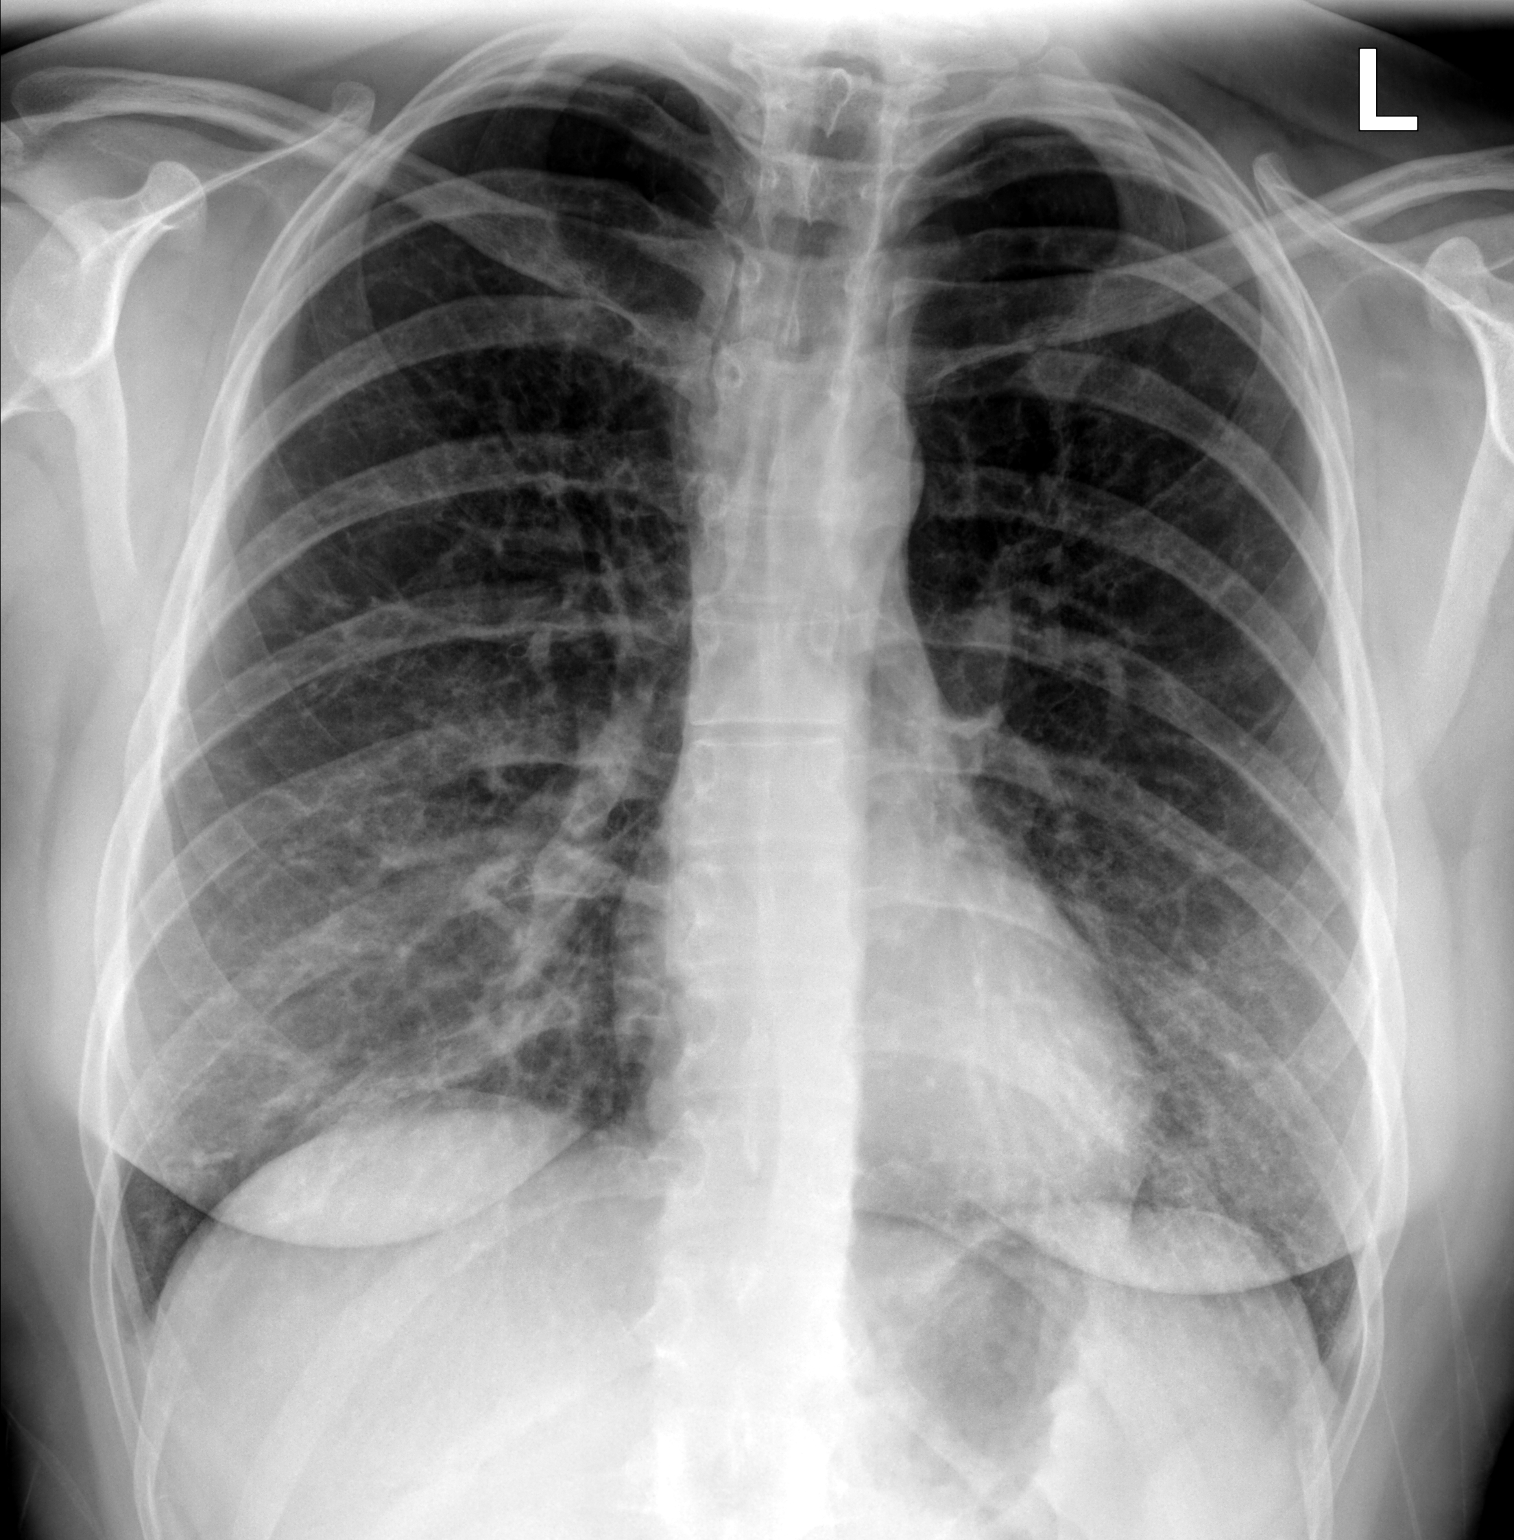

[chest lat]
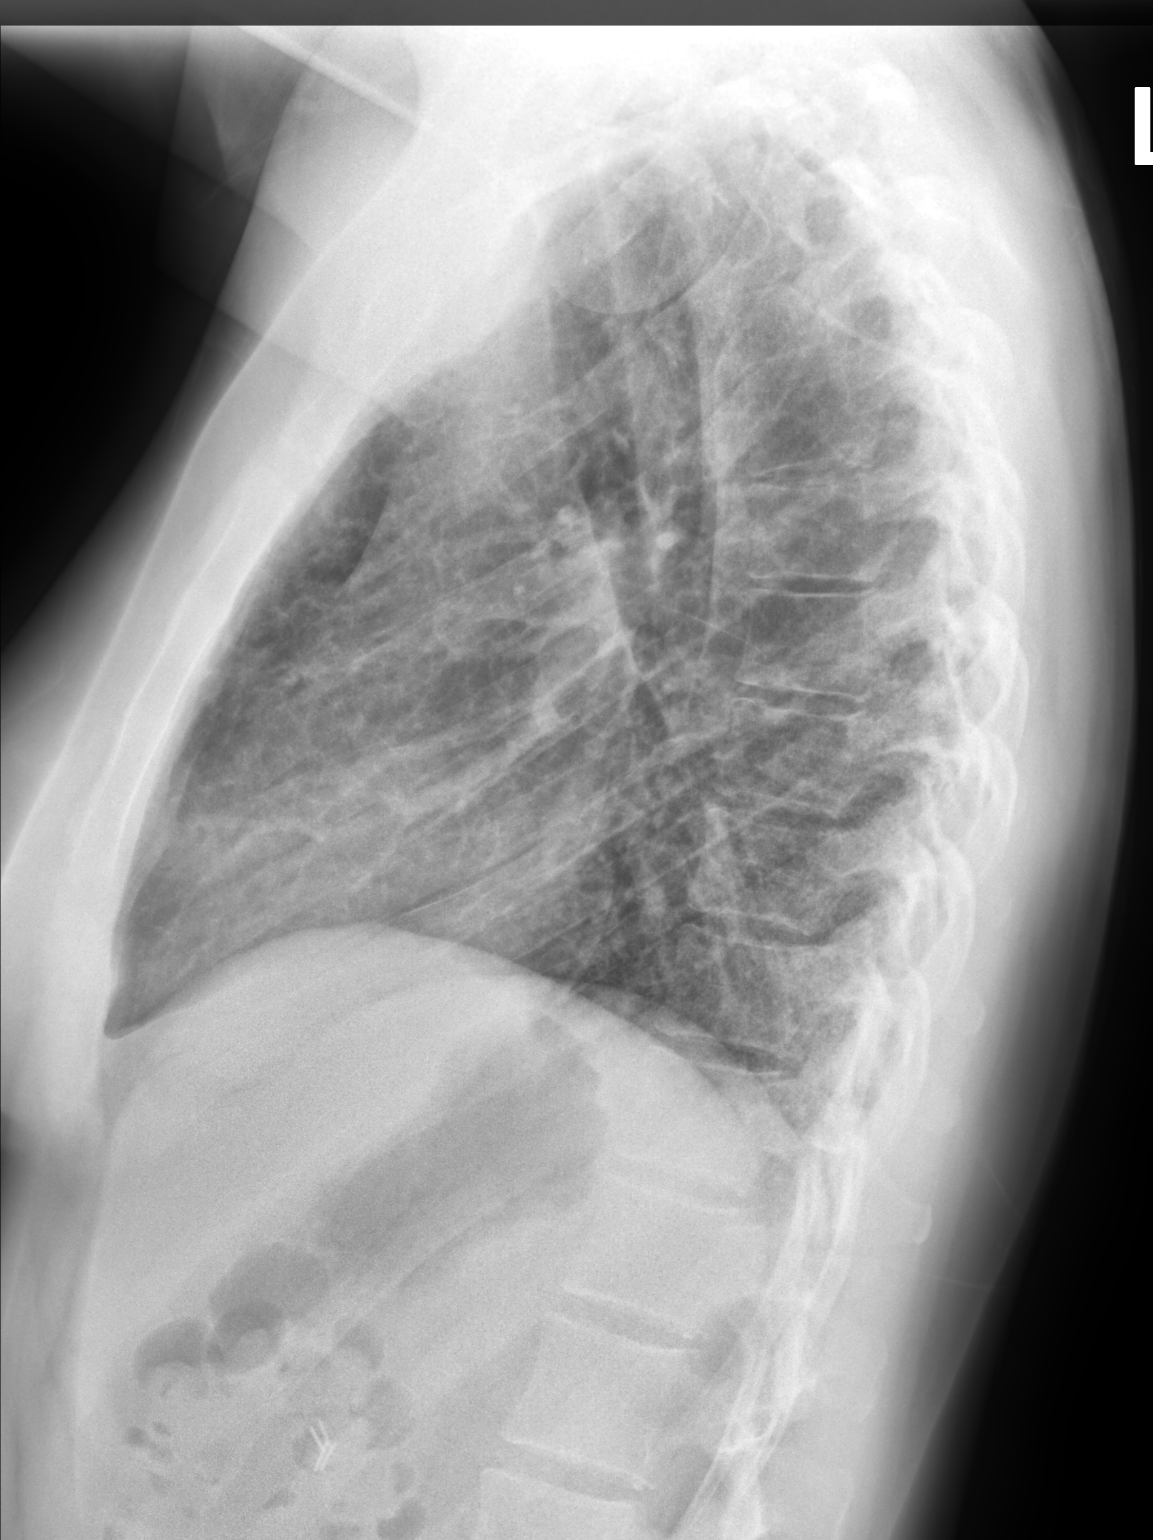

[2 of 2 positions shown; findings below may reference images not displayed]

FINDINGS: Somewhat coarse bibasilar interstitial markings, right greater than
left, increased from previous. Scattered small nodular opacities are
noted peripherally in the right mid lung. No confluent airspace
disease.

Heart size and mediastinal contours are within normal limits.

No effusion.

Visualized bones unremarkable.  Cholecystectomy clips.
IMPRESSION: 1. Asymmetric coarse interstitial markings with peripheral nodular
opacities in the mid right lung, possibly infectious/inflammatory
but nonspecific.
Followup PA and lateral chest X-ray is recommended in 3-4 weeks
following trial of antibiotic therapy to ensure resolution and
exclude underlying malignancy.

## 2023-10-17 IMAGING — CR DG CHEST 2V
1 series · 2 of 2 positions shown · non-contrast
Comparison: October 18, 2021.

CLINICAL DATA: Persistent cough.

EXAM:
CHEST - 2 VIEW

[Series 1: dg chest 2 view · 0.14mm/px · 2 of 2 slices shown]
[im 1/2]
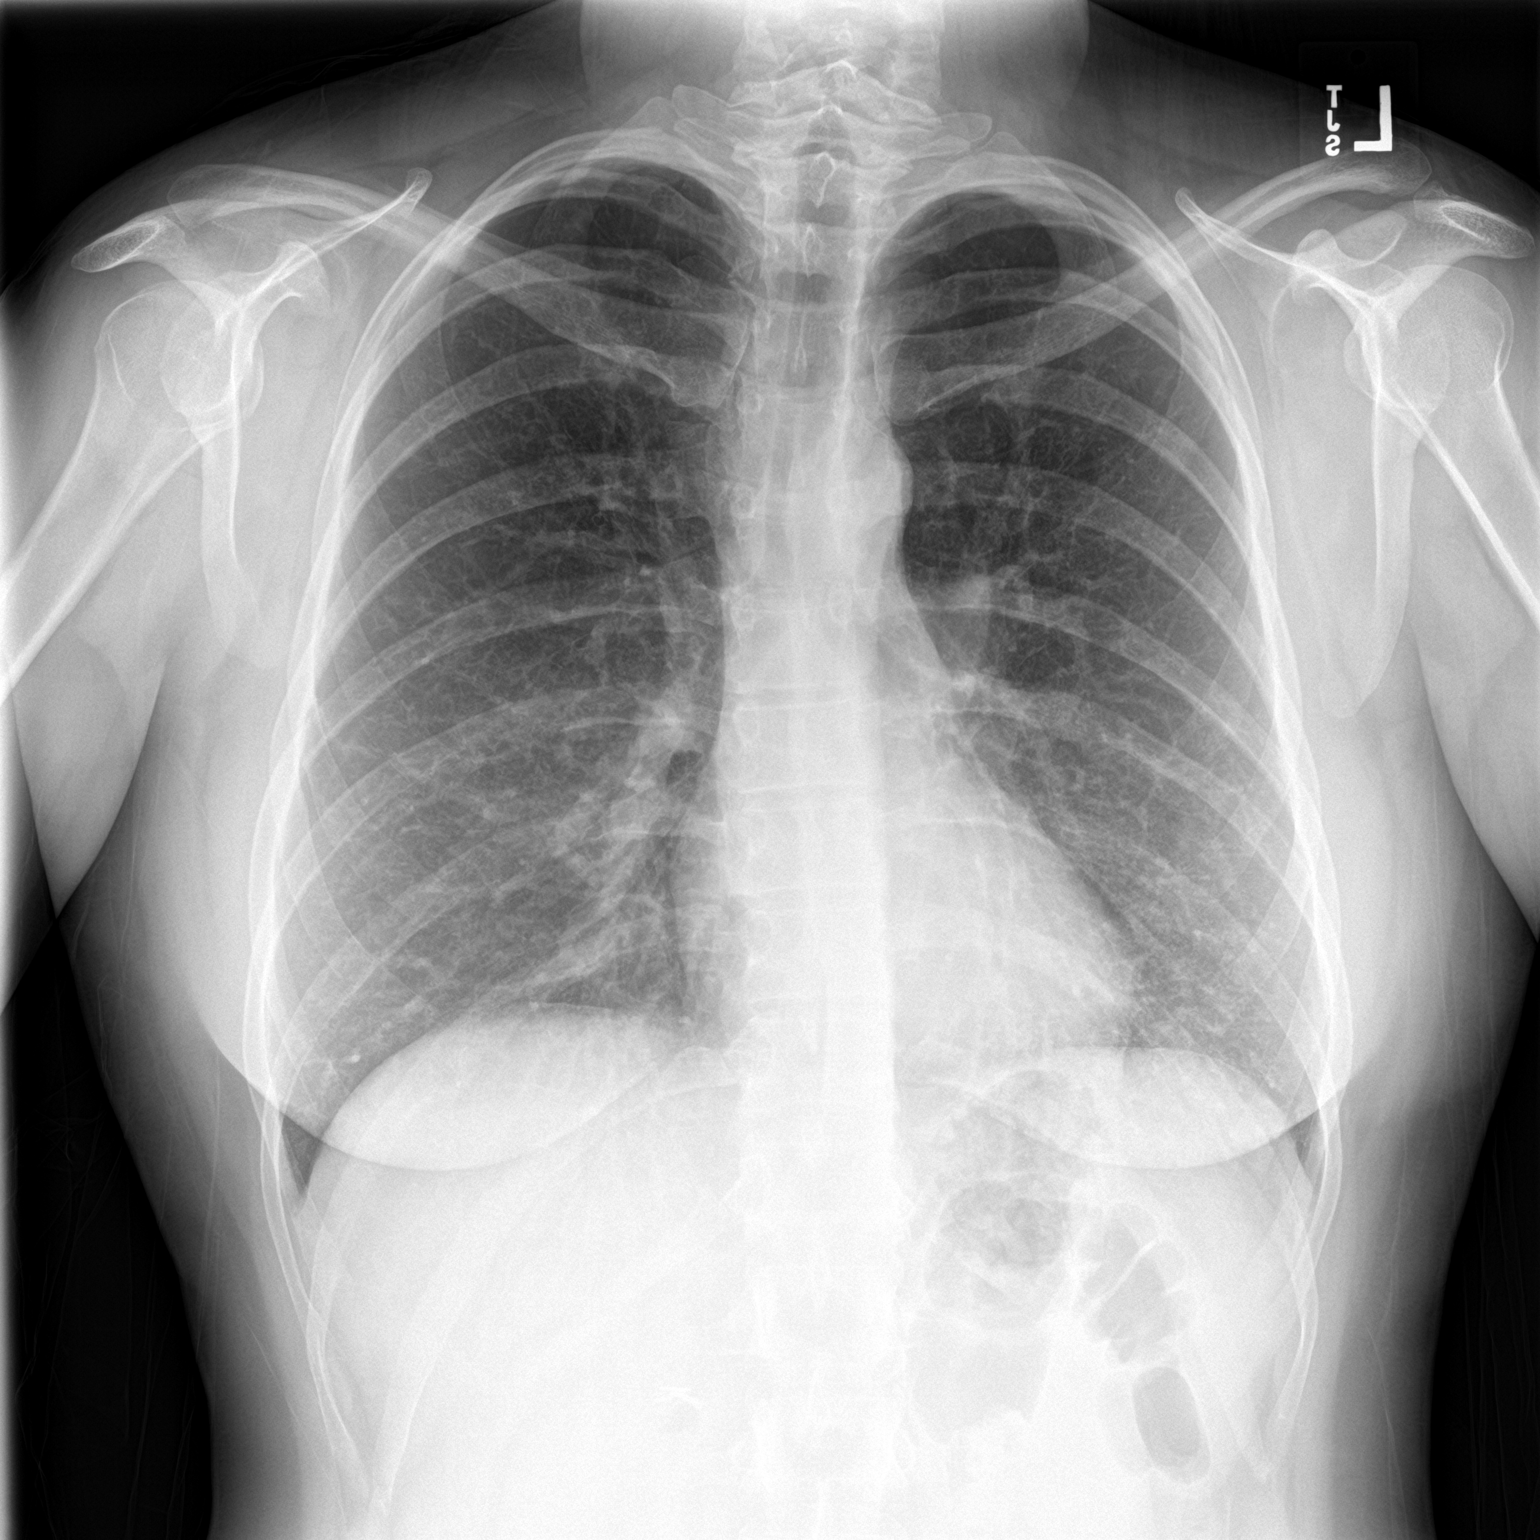
[im 2/2]
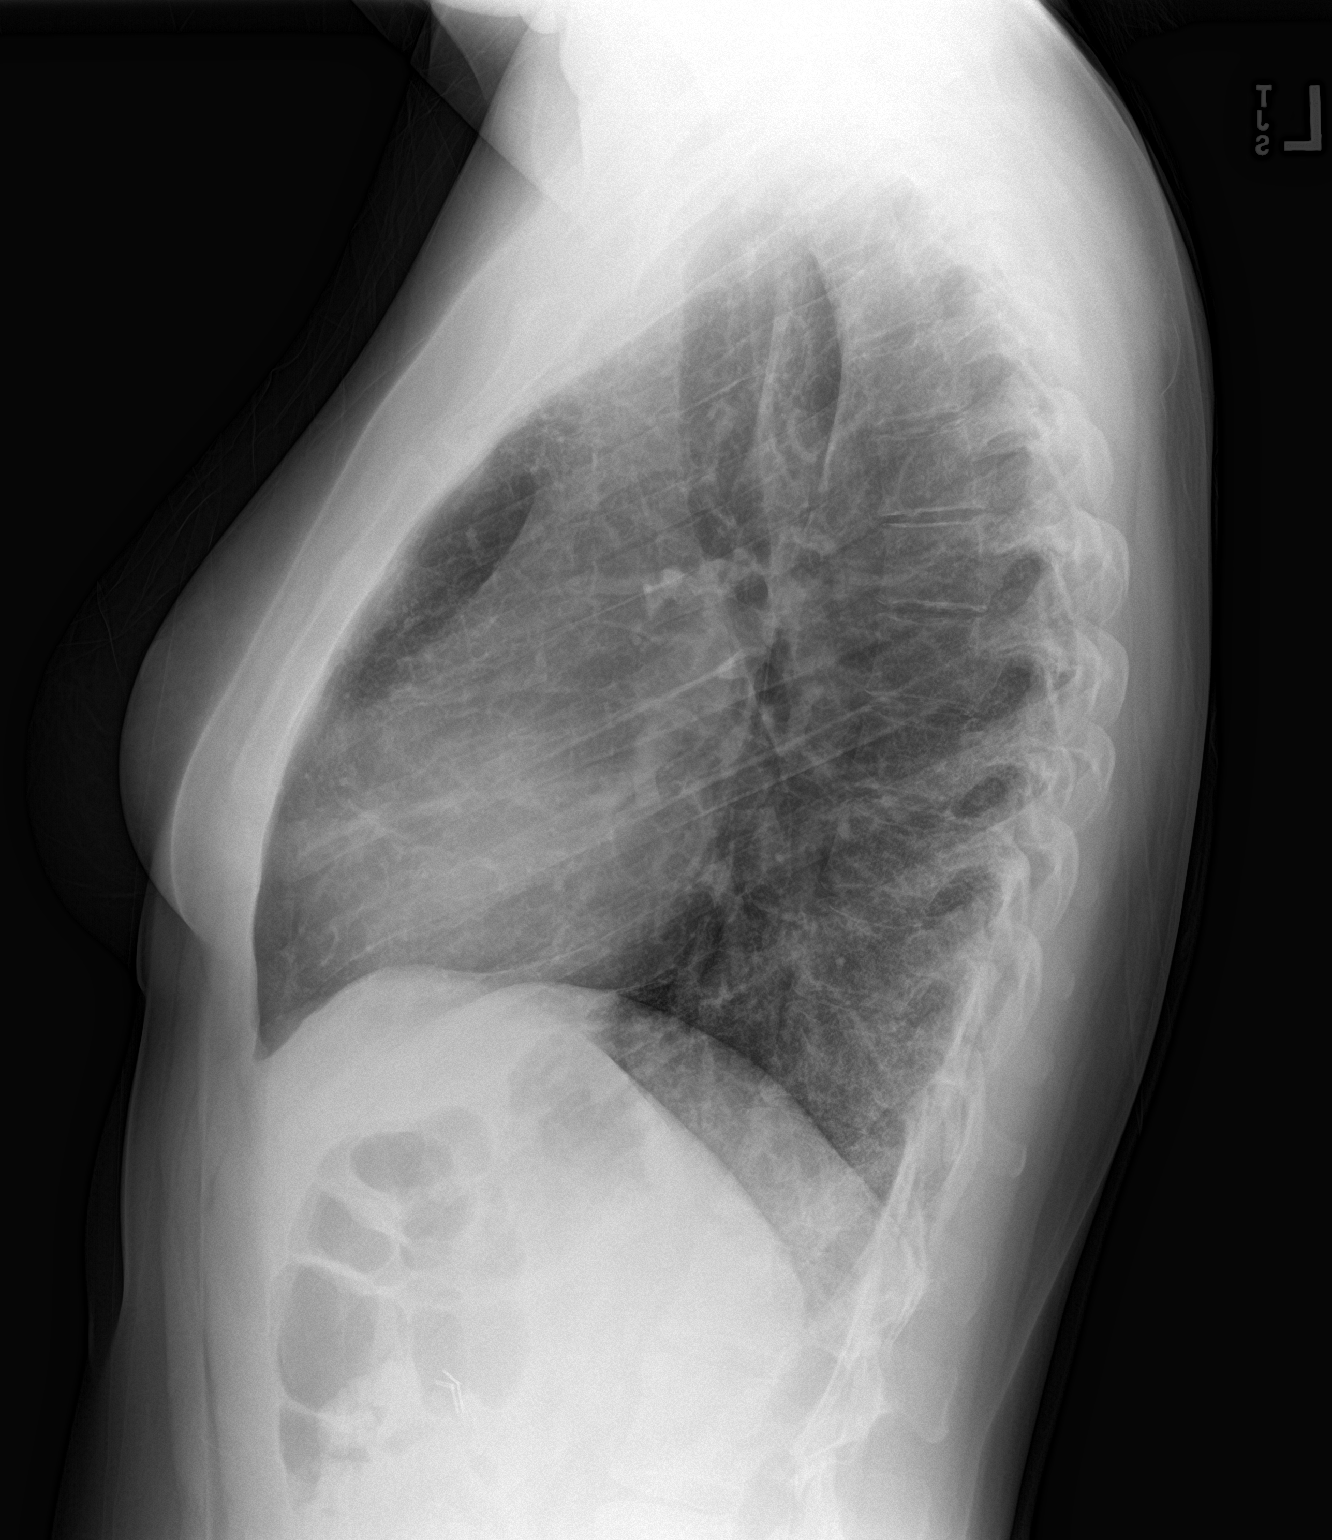

[2 of 2 positions shown; findings below may reference images not displayed]

FINDINGS: The heart size and mediastinal contours are within normal limits.
Stable mildly coarse bibasilar interstitial densities are noted,
which may represent scarring. Nodular opacity seen laterally in
right midlung is still visualized. The visualized skeletal
structures are unremarkable.
IMPRESSION: Persistent nodular opacity seen in right midlung which may represent
scarring, but nodule cannot be excluded. CT scan of the chest is
recommended for further evaluation.

## 2023-11-10 ENCOUNTER — Encounter: Payer: Self-pay | Admitting: Pulmonary Disease

## 2023-11-10 ENCOUNTER — Ambulatory Visit: Payer: No Typology Code available for payment source | Admitting: Pulmonary Disease

## 2023-11-10 VITALS — BP 110/78 | HR 70 | Temp 97.1°F | Ht 65.0 in | Wt 154.0 lb

## 2023-11-10 DIAGNOSIS — M3481 Systemic sclerosis with lung involvement: Secondary | ICD-10-CM

## 2023-11-10 DIAGNOSIS — R053 Chronic cough: Secondary | ICD-10-CM | POA: Diagnosis not present

## 2023-11-10 DIAGNOSIS — J683 Other acute and subacute respiratory conditions due to chemicals, gases, fumes and vapors: Secondary | ICD-10-CM | POA: Diagnosis not present

## 2023-11-10 DIAGNOSIS — J849 Interstitial pulmonary disease, unspecified: Secondary | ICD-10-CM

## 2023-11-10 NOTE — Patient Instructions (Signed)
 VISIT SUMMARY:  [name], you visited us  today for a follow-up on your systemic sclerosis with interstitial lung disease (NSIP) and a recent episode of walking pneumonia. You reported no current symptoms and have not needed to use Airsupra . We discussed your recent experience with pleurisy and the importance of monitoring your health closely.  YOUR PLAN:  -SYSTEMIC SCLEROSIS WITH INTERSTITIAL LUNG DISEASE (NSIP): Systemic sclerosis is a chronic connective tissue disease, and NSIP is a type of lung disease associated with it. You are currently not experiencing any shortness of breath, and recent imaging showed no acute changes. We will monitor your condition with a six-minute walk test, pulmonary function test, and high-resolution CT in July. Please follow up after these tests unless you experience any problems.  -WALKING PNEUMONIA: Walking pneumonia is a milder form of pneumonia. Your recent episode has resolved, but it is important to monitor for any recurrence of symptoms. Please call us  if you develop any new symptoms.  -GENERAL HEALTH MAINTENANCE: We discussed the importance of getting a flu shot, especially given your lung condition. The flu shot can help prevent influenza, which can be more severe for people with underlying health issues. We recommend you get vaccinated.  INSTRUCTIONS:  Please schedule your six-minute walk test, pulmonary function test, and high-resolution CT for July. Follow up with us  after these tests unless you experience any problems. Additionally, monitor for any recurrence of pneumonia symptoms and call us  if you develop any new symptoms. We also recommend getting your flu shot to help prevent influenza.

## 2023-11-10 NOTE — Progress Notes (Signed)
 Subjective:    Patient ID: [name], female    DOB: [birth year], 38 y.o.   MRN: [id number]  Patient Care Team: Gretta Comer POUR, NP as PCP - General (Internal Medicine) Debrah Josette ORN., PA-C (Family Medicine) Dow Frederic BIRCH, MD as Referring Physician (Orthopedic Surgery) Stuart Norris, NP as Nurse Practitioner (Obstetrics and Gynecology) Tobie Lady POUR, MD as Consulting Physician (Rheumatology) Tamea Dedra CROME, MD as Consulting Physician (Pulmonary Disease)  Chief Complaint  Patient presents with   Follow-up    No SOB or wheezing. Occasional cough.    BACKGROUND/INTERVAL: [name] is a 38 year old very complex lifelong never smoker with systemic sclerosis and lung involvement who presents for follow-up.  She was last seen on 14 September 2023 for acute visit at that time for pleuritic back pain.  She was treated empirically for suspected mycoplasma pneumonia.  HPI Discussed the use of AI scribe software for clinical note transcription with the patient, who gave verbal consent to proceed.  History of Present Illness   [name], a patient with a complex medical history including systemic sclerosis and interstitial lung disease (NSIP), presents for a follow-up visit. The patient had recently experienced an acute episode of what was suspected to be walking pneumonia. Post-episode, the patient reported complete resolution of the associated pain and denied any current shortness of breath. She also reported not needing to use the Airsupra  which she uses for reactive airways disease.  During the acute episode, the patient experienced a unique pain, described as 'weird,' localized to a specific area on her side. This pain was later identified as pleurisy, correlating with a small patch identified on a previous x-ray. The patient had never experienced this type of pain before.  This has now resolved after she was treated with azithromycin  and Anaprox  DS.  Overall feels markedly  better.  Does not endorse any issues with dyspnea, cough or sputum production.  No hemoptysis.  The patient had not yet received a flu shot and had never received one before.  She does not wish to get 1 at this point.     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  No bronchodilator response.  Lung volumes mildly decreased TLC at 81%, RV at 79%, diffusion capacity mildly reduced at 67%.  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  No significant change from study performed 18 December 2021 05/29/2023 CT Chest high res: Independent review shows no significant change from prior, however still not read by radiologist at the time of this visit. 06/02/2023 PFTs: FEV1 2.43 L or 76% predicted, FVC 3.33 L or 86% predicted, lung volumes within  normal limits.  Diffusion capacity mildly reduced at 68% no statistical change from prior. 09/21/2023 chest x-ray PA and lateral: Ill-defined  opacities corresponding to chronic groundglass densities on prior CT.  No acute changes (on independent review the patient had a small posterior infiltrate on the left).  Review of Systems A 10 point review of systems was performed and it is as noted above otherwise negative.   Patient Active Problem List   Diagnosis Date Noted   Preventative health care 09/18/2022   Systemic sclerosis with lung involvement (HCC) 01/30/2022   ILD (interstitial lung disease) (HCC) 01/30/2022   Chronic cough 10/18/2021   Chronic right-sided low back pain 06/25/2020   Seasonal allergies 02/02/2020   Migraines 06/27/2019   Abnormal Pap smear 09/06/2013    Social History   Tobacco Use   Smoking status: Never   Smokeless tobacco: Never  Substance Use Topics   Alcohol use: Yes    Comment: socially    Allergies  Allergen Reactions   Tioconazole Swelling    Patient states she had vaginal swelling only; no edema elsewhere.    Amoxicillin-Pot Clavulanate    Escitalopram     made me angry    Current Meds  Medication Sig   Albuterol -Budesonide (AIRSUPRA ) 90-80 MCG/ACT AERO Inhale 2 puffs into the lungs every 4 (four) hours as needed.   fluconazole  (DIFLUCAN ) 150 MG tablet Take 1 tablet (150 mg total) by mouth daily.   hydroxychloroquine (PLAQUENIL) 200 MG tablet Take 200 mg by mouth daily.   mycophenolate (CELLCEPT) 500 MG tablet Take by mouth 2 (two) times daily.   naproxen  sodium (ANAPROX  DS) 550 MG tablet 2 times daily with meals as needed for mild to moderate pain.   omeprazole  (PRILOSEC) 40 MG capsule TAKE 1 CAPSULE (40 MG TOTAL) BY MOUTH DAILY.   SUMAtriptan  (IMITREX ) 50 MG tablet Take 1 tablet by mouth at migraine onset and may repeat in 2 hours if headache persists or recurs.   tiZANidine  (ZANAFLEX ) 4 MG tablet Take by mouth. As needed   VIENVA 0.1-20 MG-MCG tablet Take 1 tablet by mouth daily.    Immunization History  Administered Date(s) Administered   PFIZER(Purple Top)SARS-COV-2  Vaccination 12/05/2019, 01/02/2020, 01/27/2020, 02/21/2020, 10/10/2020   Tdap 08/22/2019        Objective:   BP 110/78 (BP Location: Right Arm, Cuff Size: Normal)   Pulse 70   Temp (!) 97.1 F (36.2 C)   Ht 5' 5 (1.651 m)   Wt 154 lb (69.9 kg)   SpO2 98%   BMI 25.63 kg/m   SpO2: 98 % O2 Device: None (Room air)  GENERAL: Well-developed, well-nourished young woman, no acute distress, fully ambulatory.  No conversational dyspnea. No throat clearing, no cough noted.  Well-appearing. HEAD: Normocephalic, atraumatic.  EYES: Pupils equal, round, reactive to light.  No scleral icterus.  MOUTH: Tightening of the perioral skin is not as pronounced. NECK: Supple. No thyromegaly. Trachea midline. No JVD.  No adenopathy. PULMONARY: Good air entry bilaterally.  No adventitious sounds. CARDIOVASCULAR: S1 and S2. Regular rate and rhythm.  No rubs, murmurs or gallops heard. ABDOMEN: Benign. MUSCULOSKELETAL: No joint deformity, early sclerodactyly, very mild clubbing. NEUROLOGIC: No overt focal deficit, no gait disturbance, speech is fluent. SKIN: Intact,warm,dry.  The previously noted flushed appearance to her face and neck has resolved.   PSYCH: Mood and behavior normal.     Assessment & Plan:     ICD-10-CM   1. ILD (interstitial lung disease) (HCC)  J84.9 CT Chest  High Resolution    Pulmonary Function Test [facility] Only    6 minute walk   Reassess in July    2. Systemic sclerosis with lung involvement (HCC)  M34.81 CT Chest High Resolution    Pulmonary Function Test [facility] Only    6 minute walk   Follows with rheumatology    3. 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 Pulmonary Function Test [facility] Only   No recent issues Continue as needed AirSupra     4. Chronic cough  R05.3 Pulmonary Function Test [facility] Only   No recent issues      Orders Placed This Encounter  Procedures   CT Chest High Resolution    Please schedule towards end of July 2025    Standing Status:    Future    Expected Date:   06/02/2024    Expiration Date:   11/09/2024    Is patient pregnant?:   No    Preferred imaging location?:   [facility]   Pulmonary Function Test [facility] Only    Standing Status:   Future    Expected Date:   05/30/2024    Expiration Date:   11/09/2024    Scheduling Instructions:     Please schedule towards end of July 2025    Full PFT: includes the following: basic spirometry, spirometry pre & post bronchodilator, diffusion capacity (DLCO), lung volumes:   Full PFT    This test can only be performed at:   [facility]   6 minute walk    Standing Status:   Future    Expiration Date:   11/09/2024    Scheduling Instructions:     Please schedule towards end of July 2025    Where should this test be performed?:   other   Discussion:    Systemic Sclerosis with Interstitial Lung Disease (NSIP)   Latriece presents for follow-up of systemic sclerosis with associated NSIP. She reports no current dyspnea and has not used Airsupra  which she uses mostly for airways reactivity. Recent imaging showed no acute changes, though a small new patch was noted, consistent with a recent episode of walking pneumonia and pleurisy. The condition appears well-managed at this time. Discussed the need for a six-minute walk test, pulmonary function test, and high-resolution CT in July to monitor disease progression.   - Order six-minute walk test   - Schedule pulmonary function test and high-resolution CT for July   - Follow up after tests unless problems arise    Walking Pneumonia   Recent episode of walking pneumonia with pleurisy, now resolved. No current symptoms reported. Discussed the importance of monitoring for recurrence and advised to call if any new symptoms develop.   - Monitor for recurrence of symptoms   - Advise to call if any new symptoms develop    General Health Maintenance   Discussed the importance of influenza vaccination. [name] has never received a flu shot and is  hesitant. Explained the benefits of vaccination in preventing influenza, especially given her underlying lung condition.   - Recommend influenza vaccination.    Advised if symptoms do not improve or worsen, to please contact office for sooner follow up or seek emergency care.    I spent 30 minutes of dedicated to the care of this patient on the date of this encounter to include pre-visit review of records, face-to-face time with the patient discussing conditions above, post visit ordering of testing, clinical documentation with the electronic health record, making appropriate referrals as documented,  and communicating necessary findings to members of the patients care team.     C. Leita Sanders, MD Advanced Bronchoscopy PCCM [facility]    *This note was generated using voice recognition software/Dragon and/or AI transcription program.  Despite best efforts to proofread, errors can occur which can change the meaning. Any transcriptional errors that result from this process are unintentional and may not be fully corrected at the time of dictation.

## 2024-01-12 ENCOUNTER — Other Ambulatory Visit: Payer: Self-pay | Admitting: Pulmonary Disease

## 2024-01-19 ENCOUNTER — Telehealth: Payer: Self-pay | Admitting: Pulmonary Disease

## 2024-01-19 NOTE — Telephone Encounter (Signed)
 Patient advised that Dr. Georgann Housekeeper schedule is not available for July. And we will call for PFT and Ct scan closer to July. Nothing further needed.

## 2024-01-19 NOTE — Telephone Encounter (Signed)
 Patient needs an ultrasound, PFT and CT scan to be scheduled in July as well as a follow up appointment with Dr.Gonazales. Patient can be reached at [phone]

## 2024-01-20 ENCOUNTER — Ambulatory Visit: Admitting: Internal Medicine

## 2024-01-20 ENCOUNTER — Encounter: Payer: Self-pay | Admitting: Internal Medicine

## 2024-01-20 VITALS — BP 122/84 | HR 88 | Temp 98.1°F | Ht 65.0 in | Wt 155.0 lb

## 2024-01-20 DIAGNOSIS — M25562 Pain in left knee: Secondary | ICD-10-CM | POA: Diagnosis not present

## 2024-01-20 NOTE — Progress Notes (Signed)
 Subjective:    Patient ID: [name], female    DOB: 1986/04/05, 38 y.o.   MRN: [id number]  HPI Here due to left knee pain  Went to [facility] and noted right foot swelling--last month Thinks she overcompensated due to this and was limping and leaning on left leg more Also doing exercise routine--may have done something---Pilates/cardio  Pain over the past month Throbbing at times--even in bed Sometimes shoots down her leg Mostly pain in back of knee---may have had some swelling briefly No calf swelling  Tried left over tizanidine and naproxen---some help  Current Outpatient Medications on File Prior to Visit  Medication Sig Dispense Refill   hydroxychloroquine (PLAQUENIL) 200 MG tablet Take 200 mg by mouth daily.     mycophenolate (CELLCEPT) 500 MG tablet Take by mouth 2 (two) times daily.     naproxen sodium (ANAPROX DS) 550 MG tablet 2 times daily with meals as needed for mild to moderate pain. 20 tablet 1   omeprazole (PRILOSEC) 40 MG capsule TAKE 1 CAPSULE (40 MG TOTAL) BY MOUTH DAILY. 90 capsule 2   SUMAtriptan (IMITREX) 50 MG tablet Take 1 tablet by mouth at migraine onset and may repeat in 2 hours if headache persists or recurs. 10 tablet 0   tiZANidine (ZANAFLEX) 4 MG tablet Take by mouth. As needed     VIENVA 0.1-20 MG-MCG tablet Take 1 tablet by mouth daily.     Albuterol-Budesonide (AIRSUPRA) 90-80 MCG/ACT AERO Inhale 2 puffs into the lungs every 4 (four) hours as needed. (Patient not taking: Reported on 01/20/2024)     Albuterol-Budesonide (AIRSUPRA) 90-80 MCG/ACT AERO Inhale 2 puffs into the lungs every 4 (four) hours as needed (cough, shortness of breath). (Patient not taking: Reported on 01/20/2024) 10.7 g 3   No current facility-administered medications on file prior to visit.    Allergies  Allergen Reactions   Tioconazole Swelling    Patient states she had vaginal swelling only; no edema elsewhere.    Amoxicillin-Pot Clavulanate    Escitalopram     "made me  angry"    Past Medical History:  Diagnosis Date   Abdominal pain    Chickenpox    Diarrhea    Fatigue    Migraines    Nausea    Poor appetite    Symptomatic cholelithiasis 06/24/2011   Wears glasses    Weight loss     Past Surgical History:  Procedure Laterality Date   CHOLECYSTECTOMY     LAPAROSCOPIC CHOLECYSTECTOMY  06/28/11   WISDOM TOOTH EXTRACTION      Family History  Problem Relation Age of Onset   Cervical cancer Mother    Multiple sclerosis Mother    Arthritis Maternal Grandmother    Hyperlipidemia Maternal Grandmother    Stomach cancer Maternal Grandfather    Lung cancer Paternal Grandfather     Social History   Socioeconomic History   Marital status: Married    Spouse name: Not on file   Number of children: Not on file   Years of education: Not on file   Highest education level: Some college, no degree  Occupational History   Not on file  Tobacco Use   Smoking status: Never   Smokeless tobacco: Never  Substance and Sexual Activity   Alcohol use: Yes    Comment: socially   Drug use: Not Currently   Sexual activity: Not on file  Other Topics Concern   Not on file  Social History Narrative   Married.  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       Social Drivers of Corporate investment banker Strain: Low Risk  (01/19/2024)   Overall Financial Resource Strain (CARDIA)    Difficulty of Paying Living Expenses: Not hard at all  Food Insecurity: No Food Insecurity (01/19/2024)   Hunger Vital Sign    Worried About Running Out of Food in the Last Year: Never true    Ran Out of Food in the Last Year: Never true  Transportation Needs: No Transportation Needs (01/19/2024)   PRAPARE - Administrator, Civil Service (Medical): No    Lack of Transportation (Non-Medical): No  Physical Activity: Sufficiently Active (01/19/2024)   Exercise Vital Sign    Days of Exercise per Week: 7 days    Minutes of Exercise per Session: 90 min  Stress: No Stress  Concern Present (01/19/2024)   [employer] of Occupational Health - Occupational Stress Questionnaire    Feeling of Stress : Not at all  Social Connections: Moderately Isolated (01/19/2024)   Social Connection and Isolation Panel [NHANES]    Frequency of Communication with Friends and Family: Once a week    Frequency of Social Gatherings with Friends and Family: More than three times a week    Attends Religious Services: Never    Database administrator or Organizations: No    Attends Engineer, structural: Not on file    Marital Status: Married  Catering manager Violence: Not on file   Review of Systems Notes more 'crunching in knees" lately Kneels a lot at [employer]    Objective:   Physical Exam Constitutional:      Appearance: Normal appearance.  Musculoskeletal:     Comments: No right knee synovitis ?very slight posterior swelling--but no clear Baker's cyst Normal ROM and no crepitus No ligament or meniscus findings  Both knees have hard enlarged bursae   Neurological:     Mental Status: She is alert.     Comments: Normal gait No leg weakness            Assessment & Plan:

## 2024-01-20 NOTE — Assessment & Plan Note (Signed)
 Likely just strain from overuse at [facility] Could have small Baker's cyst--but not clear cut Knee is otherwise quiet---reassured no major internal issues Discussed pads on knees for her bending/kneeling Diclofenac gel tid prn--and can continue the naproxen If not improving--can set up with Dr Patsy Lager

## 2024-03-01 ENCOUNTER — Encounter: Payer: Self-pay | Admitting: Primary Care

## 2024-03-01 ENCOUNTER — Ambulatory Visit (INDEPENDENT_AMBULATORY_CARE_PROVIDER_SITE_OTHER): Payer: Self-pay | Admitting: Primary Care

## 2024-03-01 VITALS — BP 120/78 | HR 98 | Temp 97.4°F | Ht 65.0 in | Wt 140.2 lb

## 2024-03-01 DIAGNOSIS — R21 Rash and other nonspecific skin eruption: Secondary | ICD-10-CM | POA: Diagnosis not present

## 2024-03-01 DIAGNOSIS — M3481 Systemic sclerosis with lung involvement: Secondary | ICD-10-CM

## 2024-03-01 DIAGNOSIS — G43009 Migraine without aura, not intractable, without status migrainosus: Secondary | ICD-10-CM | POA: Diagnosis not present

## 2024-03-01 DIAGNOSIS — J849 Interstitial pulmonary disease, unspecified: Secondary | ICD-10-CM | POA: Diagnosis not present

## 2024-03-01 DIAGNOSIS — E785 Hyperlipidemia, unspecified: Secondary | ICD-10-CM

## 2024-03-01 DIAGNOSIS — M545 Low back pain, unspecified: Secondary | ICD-10-CM

## 2024-03-01 DIAGNOSIS — G8929 Other chronic pain: Secondary | ICD-10-CM

## 2024-03-01 DIAGNOSIS — Z0001 Encounter for general adult medical examination with abnormal findings: Secondary | ICD-10-CM | POA: Diagnosis not present

## 2024-03-01 DIAGNOSIS — K219 Gastro-esophageal reflux disease without esophagitis: Secondary | ICD-10-CM | POA: Insufficient documentation

## 2024-03-01 LAB — LIPID PANEL
Cholesterol: 184 mg/dL (ref 0–200)
HDL: 51.3 mg/dL (ref >39.00)
LDL Cholesterol: 118 mg/dL — ABNORMAL HIGH (ref 0–99)
NonHDL: 132.88
Total CHOL/HDL Ratio: 4
Triglycerides: 75 mg/dL (ref 0.0–149.0)
VLDL: 15 mg/dL (ref 0.0–40.0)

## 2024-03-01 MED ORDER — TIZANIDINE HCL 4 MG PO TABS: 4.0000 mg | ORAL_TABLET | Freq: Every day | ORAL | 0 refills | Status: AC | PRN

## 2024-03-01 MED ORDER — CICLOPIROX OLAMINE 0.77 % EX CREA
TOPICAL_CREAM | Freq: Two times a day (BID) | CUTANEOUS | 0 refills | Status: DC | PRN
Start: 2024-03-01 — End: 2024-06-15

## 2024-03-01 NOTE — Assessment & Plan Note (Signed)
 Stable. Following with rheumatology.  Continue tizanidine 4 mg PRN for which she uses sparingly.

## 2024-03-01 NOTE — Progress Notes (Signed)
 Subjective:    Patient ID: [name], female    DOB: Nov 15, 1985, 38 y.o.   MRN: [id number]  HPI  [name] is a very pleasant 38 y.o. female who presents today for complete physical and follow up of chronic conditions.  She would like to discuss rash. Acute for the last 2 weeks with a ring like red rash to the left anterior thigh. She works in a Recruitment consultant. She's been using Lotrimin cream OTC with little improvement.  She's also noticed intermittent, dry, red patches to her upper and lower extremities. Chronic for years. She denies itching.   Immunizations: -Tetanus: Completed in 2020  Diet: Fair diet.  Exercise: Regular exercise twice daily   Eye exam: Completes annually  Dental exam: Completes semi-annually    Pap Smear: Completed in May 2022, follows with GYN   Wt Readings from Last 3 Encounters:  03/01/24 140 lb 3.2 oz (63.6 kg)  01/20/24 155 lb (70.3 kg)  11/10/23 154 lb (69.9 kg)      Review of Systems  Constitutional:  Negative for unexpected weight change.  HENT:  Negative for rhinorrhea.   Respiratory:  Negative for cough and shortness of breath.   Cardiovascular:  Negative for chest pain.  Gastrointestinal:  Negative for constipation and diarrhea.  Genitourinary:  Negative for difficulty urinating and menstrual problem.  Musculoskeletal:  Positive for back pain.  Skin:  Positive for rash.  Allergic/Immunologic: Negative for environmental allergies.  Neurological:  Negative for dizziness, numbness and headaches.  Psychiatric/Behavioral:  The patient is not nervous/anxious.          Past Medical History:  Diagnosis Date   Abdominal pain    Arthritis    Asthma    Chickenpox    Diarrhea    Fatigue    GERD (gastroesophageal reflux disease)    Migraines    Nausea    Poor appetite    Symptomatic cholelithiasis 06/24/2011   Wears glasses    Weight loss     Social History   Socioeconomic History   Marital status: Married    Spouse name:  Not on file   Number of children: Not on file   Years of education: Not on file   Highest education level: Some college, no degree  Occupational History   Not on file  Tobacco Use   Smoking status: Never   Smokeless tobacco: Never  Substance and Sexual Activity   Alcohol use: Not Currently    Comment: socially   Drug use: Never   Sexual activity: Yes    Birth control/protection: Pill  Other Topics Concern   Not on file  Social History Narrative   Married.   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       Social Drivers of Corporate investment banker Strain: Low Risk  (01/19/2024)   Overall Financial Resource Strain (CARDIA)    Difficulty of Paying Living Expenses: Not hard at all  Food Insecurity: No Food Insecurity (01/19/2024)   Hunger Vital Sign    Worried About Running Out of Food in the Last Year: Never true    Ran Out of Food in the Last Year: Never true  Transportation Needs: No Transportation Needs (01/19/2024)   PRAPARE - Administrator, Civil Service (Medical): No    Lack of Transportation (Non-Medical): No  Physical Activity: Sufficiently Active (01/19/2024)   Exercise Vital Sign    Days of Exercise per Week: 7 days    Minutes  of Exercise per Session: 90 min  Stress: No Stress Concern Present (01/19/2024)   [employer] of Occupational Health - Occupational Stress Questionnaire    Feeling of Stress : Not at all  Social Connections: Moderately Isolated (01/19/2024)   Social Connection and Isolation Panel [NHANES]    Frequency of Communication with Friends and Family: Once a week    Frequency of Social Gatherings with Friends and Family: More than three times a week    Attends Religious Services: Never    Database administrator or Organizations: No    Attends Engineer, structural: Not on file    Marital Status: Married  Catering manager Violence: Not on file    Past Surgical History:  Procedure Laterality Date   CHOLECYSTECTOMY      LAPAROSCOPIC CHOLECYSTECTOMY  06/28/11   WISDOM TOOTH EXTRACTION      Family History  Problem Relation Age of Onset   Cervical cancer Mother    Multiple sclerosis Mother    ADD / ADHD Father    Arthritis Maternal Grandmother    Hyperlipidemia Maternal Grandmother    Stomach cancer Maternal Grandfather    Cancer Maternal Grandfather    Lung cancer Paternal Grandfather    Cancer Paternal Grandfather    COPD Paternal Grandmother    Diabetes Paternal Grandmother    Heart disease Paternal Grandmother     Allergies  Allergen Reactions   Tioconazole Swelling    Patient states she had vaginal swelling only; no edema elsewhere.    Amoxicillin-Pot Clavulanate    Escitalopram     "made me angry"    Current Outpatient Medications on File Prior to Visit  Medication Sig Dispense Refill   hydroxychloroquine (PLAQUENIL) 200 MG tablet Take 200 mg by mouth daily.     mycophenolate (CELLCEPT) 500 MG tablet Take by mouth 2 (two) times daily.     naproxen  sodium (ANAPROX  DS) 550 MG tablet 2 times daily with meals as needed for mild to moderate pain. 20 tablet 1   omeprazole  (PRILOSEC) 40 MG capsule TAKE 1 CAPSULE (40 MG TOTAL) BY MOUTH DAILY. 90 capsule 2   VIENVA 0.1-20 MG-MCG tablet Take 1 tablet by mouth daily.     Albuterol -Budesonide (AIRSUPRA ) 90-80 MCG/ACT AERO Inhale 2 puffs into the lungs every 4 (four) hours as needed. (Patient not taking: Reported on 01/20/2024)     SUMAtriptan  (IMITREX ) 50 MG tablet Take 1 tablet by mouth at migraine onset and may repeat in 2 hours if headache persists or recurs. (Patient not taking: Reported on 03/01/2024) 10 tablet 0   No current facility-administered medications on file prior to visit.    BP 120/78   Pulse 98   Temp (!) 97.4 F (36.3 C) (Oral)   Ht 5\' 5"  (1.651 m)   Wt 140 lb 3.2 oz (63.6 kg)   SpO2 98%   BMI 23.33 kg/m  Objective:   Physical Exam HENT:     Right Ear: Tympanic membrane and ear canal normal.     Left Ear: Tympanic  membrane and ear canal normal.  Eyes:     Pupils: Pupils are equal, round, and reactive to light.  Cardiovascular:     Rate and Rhythm: Normal rate and regular rhythm.  Pulmonary:     Effort: Pulmonary effort is normal.     Breath sounds: Normal breath sounds.  Abdominal:     General: Bowel sounds are normal.     Palpations: Abdomen is soft.     Tenderness:  There is no abdominal tenderness.  Musculoskeletal:        General: Normal range of motion.     Cervical back: Neck supple.  Skin:    General: Skin is warm and dry.     Findings: Rash present.     Comments: 3 cm rounded, mildly red rash with flesh colored center to left anterior thigh.   Neurological:     Mental Status: She is alert and oriented to person, place, and time.     Cranial Nerves: No cranial nerve deficit.     Deep Tendon Reflexes:     Reflex Scores:      Patellar reflexes are 2+ on the right side and 2+ on the left side. Psychiatric:        Mood and Affect: Mood normal.           Assessment & Plan:  Encounter for annual general medical examination with abnormal findings in adult Assessment & Plan: Immunizations UTD. Pap smear UTD. Follows with GYN  Discussed the importance of a healthy diet and regular exercise in order for weight loss, and to reduce the risk of further co-morbidity.  Exam stable. Labs pending.  Follow up in 1 year for repeat physical.    ILD (interstitial lung disease) (HCC) Assessment & Plan: Stable.  Following with pulmonology, reviewed office notes from January 2025. Continue Airsupra  90-80 mcg PRN.   Migraine without aura and without status migrainosus, not intractable Assessment & Plan: Controlled.   Continue sumatriptan  50 mg PRN.    Systemic sclerosis with lung involvement (HCC) Assessment & Plan: Stable.  Following with rheumatology, reviewed office notes from January 2025 through [facility].  Continue Plaquenil 200 mg daily, Cellcept 500 mg BID.     Gastroesophageal reflux disease, unspecified whether esophagitis present Assessment & Plan: Controlled.  Continue omeprazole  40 mg daily.    Chronic right-sided low back pain, unspecified whether sciatica present Assessment & Plan: Stable. Following with rheumatology.  Continue tizanidine 4 mg PRN for which she uses sparingly.  Orders: -     tiZANidine HCl; Take 1 tablet (4 mg total) by mouth daily as needed for muscle spasms.  Dispense: 30 tablet; Refill: 0  Rash and nonspecific skin eruption Assessment & Plan: There are two separate rashes going on today.  The rash to her left anterior thigh is a tinea corporis. Her rashes to her upper extremities are unclear.  Start ciclopirox 0.77% cream twice daily as needed x 1 week.  She will update. Referral placed to dermatology.  Orders: -     Ambulatory referral to Dermatology -     Ciclopirox Olamine; Apply topically 2 (two) times daily as needed.  Dispense: 15 g; Refill: 0  Hyperlipidemia, unspecified hyperlipidemia type -     Lipid panel        Gabriel John, NP

## 2024-03-01 NOTE — Assessment & Plan Note (Signed)
 Stable.  Following with rheumatology, reviewed office notes from January 2025 through [facility].  Continue Plaquenil 200 mg daily, Cellcept 500 mg BID.

## 2024-03-01 NOTE — Assessment & Plan Note (Signed)
 Immunizations UTD. Pap smear UTD.  Follows with GYN  Discussed the importance of a healthy diet and regular exercise in order for weight loss, and to reduce the risk of further co-morbidity.  Exam stable. Labs pending.  Follow up in 1 year for repeat physical.

## 2024-03-01 NOTE — Assessment & Plan Note (Signed)
 Stable.  Following with pulmonology, reviewed office notes from January 2025. Continue Airsupra  90-80 mcg PRN.

## 2024-03-01 NOTE — Assessment & Plan Note (Signed)
Controlled.  Continue sumatriptan 50 mg PRN.  

## 2024-03-01 NOTE — Assessment & Plan Note (Signed)
 There are two separate rashes going on today.  The rash to her left anterior thigh is a tinea corporis. Her rashes to her upper extremities are unclear.  Start ciclopirox 0.77% cream twice daily as needed x 1 week.  She will update. Referral placed to dermatology.

## 2024-03-01 NOTE — Patient Instructions (Addendum)
 Stop by the lab prior to leaving today. I will notify you of your results once received.   You may apply the ciclopirox antifungal cream twice daily x 1 week as needed.  You will either be contacted via phone regarding your referral to dermatology, or you may receive a letter on your MyChart portal from our referral team with instructions for scheduling an appointment. Please let us  know if you have not been contacted by anyone within two weeks.  It was a pleasure to see you today!

## 2024-03-01 NOTE — Assessment & Plan Note (Signed)
 Controlled.  Continue omeprazole 40 mg daily.

## 2024-04-08 ENCOUNTER — Ambulatory Visit: Admitting: Primary Care

## 2024-04-18 DIAGNOSIS — R21 Rash and other nonspecific skin eruption: Secondary | ICD-10-CM

## 2024-05-23 ENCOUNTER — Telehealth: Payer: Self-pay | Admitting: Pulmonary Disease

## 2024-05-23 NOTE — Telephone Encounter (Signed)
 Copied from CRM [id number]. Topic: Appointments - Appointment Scheduling >> May 19, 2024  8:18 AM Leila C wrote: Patient/patient representative is calling to schedule an appointment. Refer to attachments for appointment information.  Patient wants to schedule 6 min walk and f/u appointment with Dr. Tamea in August. Unable to schedule 6 minutes walk in decision tree. Phone system dropped the call. >> May 19, 2024  9:24 AM Leila BROCKS wrote: Patient [phone] is trying to schedule an office visit f/u with Dr. Tamea and 6 minutes walk. I cannot schedule from recalls nor active requests in Decision tree: Some providers cannot be selected. Tamea Dedra CROME, MD in [facility]: Provider does not match visit type restrictions. Tamea Dedra CROME, MD in [facility]: Provider does not match visit type restrictions. Patient is unable to wait for CAL, please call back.   Sent mychart message and left message to schedule this appointment

## 2024-06-02 ENCOUNTER — Encounter

## 2024-06-02 ENCOUNTER — Ambulatory Visit
Admission: RE | Admit: 2024-06-02 | Discharge: 2024-06-02 | Disposition: A | Discharge status: Home / Self Care | Source: Ambulatory Visit | Attending: Pulmonary Disease | Admitting: Pulmonary Disease

## 2024-06-02 DIAGNOSIS — J849 Interstitial pulmonary disease, unspecified: Secondary | ICD-10-CM | POA: Insufficient documentation

## 2024-06-02 DIAGNOSIS — M3481 Systemic sclerosis with lung involvement: Secondary | ICD-10-CM | POA: Insufficient documentation

## 2024-06-04 ENCOUNTER — Ambulatory Visit: Payer: Self-pay | Admitting: Pulmonary Disease

## 2024-06-15 ENCOUNTER — Encounter: Payer: Self-pay | Admitting: Pulmonary Disease

## 2024-06-15 ENCOUNTER — Ambulatory Visit

## 2024-06-15 ENCOUNTER — Encounter

## 2024-06-15 ENCOUNTER — Ambulatory Visit: Admitting: Pulmonary Disease

## 2024-06-15 VITALS — BP 120/80 | HR 80 | Temp 97.3°F | Ht 65.0 in | Wt 140.0 lb

## 2024-06-15 DIAGNOSIS — J849 Interstitial pulmonary disease, unspecified: Secondary | ICD-10-CM

## 2024-06-15 DIAGNOSIS — M3481 Systemic sclerosis with lung involvement: Secondary | ICD-10-CM

## 2024-06-15 DIAGNOSIS — J683 Other acute and subacute respiratory conditions due to chemicals, gases, fumes and vapors: Secondary | ICD-10-CM | POA: Diagnosis not present

## 2024-06-15 NOTE — Progress Notes (Signed)
 Patient was in the office today for a 6 min walk test.

## 2024-06-15 NOTE — Patient Instructions (Signed)
 VISIT SUMMARY:  You came in for a follow-up appointment to monitor your interstitial lung disease associated with scleroderma. Your oxygen levels were excellent during the six-minute walk test, and you remain active without significant respiratory issues. We discussed your upcoming trip to [place]  and your concerns about high elevation.  YOUR PLAN:  -INTERSTITIAL LUNG DISEASE IN THE SETTING OF SCLERODERMA: Interstitial lung disease is a condition where the lung tissue becomes scarred and stiff, making it difficult to breathe. Your condition is well-managed with no signs of progression. Your oxygen levels are good, and there are no current symptoms of cough or pulmonary hypertension. We will proceed with a pulmonary function test next week and defer the echocardiogram until next year unless your symptoms change. For your trip to [place] , maintain hydration, pace your activities, and use supplemental oxygen if needed. Avoid using Sildenafil as it can cause low blood pressure. We will follow up in six months unless you need to come in sooner.  INSTRUCTIONS:  Please call us  with the results from your pulmonary function test next week. Follow up in six months unless you experience any changes in your symptoms.

## 2024-06-15 NOTE — Progress Notes (Unsigned)
 Subjective:    Patient ID: [name], female    DOB: [birth year], 38 y.o.   MRN: [id number]  Patient Care Team: Gretta Comer POUR, NP as PCP - General (Internal Medicine) Debrah Josette ORN., PA-C (Family Medicine) Dow Frederic BIRCH, MD as Referring Physician (Orthopedic Surgery) Stuart Norris, NP as Nurse Practitioner (Obstetrics and Gynecology) Tobie Lady POUR, MD as Consulting Physician (Rheumatology) Tamea Dedra CROME, MD as Consulting Physician (Pulmonary Disease)  Chief Complaint  Patient presents with   Follow-up    DOE. No wheezing or cough.    BACKGROUND/INTERVAL:[name] is a 38 year old very complex lifelong never smoker with systemic sclerosis and lung involvement who presents for follow-up.  She was last seen on 10 November 2023.  HPI Discussed the use of AI scribe software for clinical note transcription with the patient, who gave verbal consent to proceed.  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  She is being monitored for interstitial lung disease associated with scleroderma. During a six-minute walk test performed today, her oxygen saturation was measured at 98% at the start and 99% at the end.  She was able to ambulate 510 m this is 78.5% of expected.  She remains active, frequently engaging in hiking without significant respiratory issues.  She denies persistent cough but occasionally experiences mild coughing.  She is planning a trip to [place]  and is concerned about the high elevation, particularly when visiting [place]. She has not previously been at such elevations but has visited other high-altitude locations like the [place] and [place] without significant issues.  Her past medical history includes a normal echocardiogram from July last year, which showed no evidence of pulmonary hypertension. She is scheduled for a breathing  test next week, which was delayed due to scheduling issues.     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  No bronchodilator response.  Lung volumes mildly decreased TLC at 81%, RV at 79%, diffusion capacity mildly reduced at 67%.  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  No significant change from study performed 18 December 2021 05/29/2023 CT Chest high res: Independent review shows no significant change from prior, however still not read by radiologist at the time of this visit. 06/02/2023 PFTs: FEV1 2.43 L or 76% predicted, FVC 3.33 L or 86% predicted, lung volumes within normal limits.  Diffusion capacity mildly reduced at 68% no statistical change from prior. 09/21/2023 chest x-ray PA and lateral: Ill-defined opacities corresponding to chronic groundglass densities on prior CT.  No  acute changes (on independent review the patient had a small posterior infiltrate on the  left). 06/15/2024 : Expect 6 MWD 650 m, actual 6 MWD for patient 510 m (lower limit of normal) distance walked percentage of expected 6 MWD 78.5%.  Review of Systems A 10 point review of systems was performed and it is as noted above otherwise negative.   Patient Active Problem List   Diagnosis Date Noted   GERD (gastroesophageal reflux disease) 03/01/2024   Rash and nonspecific skin eruption 03/01/2024   Left knee pain 01/20/2024   Encounter for annual general medical examination with abnormal findings in adult 09/18/2022   Systemic sclerosis with lung involvement (HCC) 01/30/2022   ILD (interstitial lung disease) (HCC) 01/30/2022   Chronic cough 10/18/2021   Chronic right-sided low back pain 06/25/2020   Seasonal allergies 02/02/2020   Migraines 06/27/2019   Abnormal Pap smear 09/06/2013    Social History   Tobacco Use   Smoking status: Never   Smokeless tobacco: Never  Substance Use Topics   Alcohol use: Not Currently    Comment: socially    Allergies  Allergen Reactions   Tioconazole Swelling    Patient states she had vaginal swelling only; no edema elsewhere.    Amoxicillin-Pot Clavulanate    Escitalopram     made me angry    Current Meds  Medication Sig   Albuterol -Budesonide (AIRSUPRA ) 90-80 MCG/ACT AERO Inhale 2 puffs into the lungs every 4 (four) hours as needed.   hydroxychloroquine (PLAQUENIL) 200 MG tablet Take 200 mg by mouth daily.   mycophenolate (CELLCEPT) 500 MG tablet Take by mouth 2 (two) times daily.   naproxen  sodium (ANAPROX  DS) 550 MG tablet 2 times daily with meals as needed for mild to moderate pain.   omeprazole  (PRILOSEC) 40 MG capsule TAKE 1 CAPSULE (40 MG TOTAL) BY MOUTH DAILY.   SUMAtriptan  (IMITREX ) 50 MG tablet Take 1 tablet by mouth at migraine onset and may repeat in 2 hours if headache persists or recurs.   tiZANidine  (ZANAFLEX ) 4 MG tablet Take 1 tablet (4 mg total) by mouth daily as needed for muscle spasms.   VIENVA 0.1-20  MG-MCG tablet Take 1 tablet by mouth daily.    Immunization History  Administered Date(s) Administered   PFIZER(Purple Top)SARS-COV-2 Vaccination 12/05/2019, 01/02/2020, 01/27/2020, 02/21/2020, 10/10/2020   Tdap 08/22/2019        Objective:     BP 120/80 (BP Location: Left Arm, Cuff Size: Normal)   Pulse 80   Temp (!) 97.3 F (36.3 C)   Ht 5' 5 (1.651 m)   Wt 140 lb (63.5 kg)   LMP 05/25/2024 (Approximate)   SpO2 98%   BMI 23.30 kg/m   SpO2: 98 % O2 Device: None (Room air)  GENERAL: Well-developed, well-nourished young woman, no acute distress, fully ambulatory.  No conversational dyspnea. No throat clearing, no cough noted.  Well-appearing. HEAD: Normocephalic, atraumatic.  EYES: Pupils equal, round, reactive to light.  No scleral icterus.  MOUTH: Tightening of the perioral skin is not as pronounced. NECK: Supple. No thyromegaly. Trachea midline. No JVD.  No adenopathy. PULMONARY: Good air entry bilaterally.  No adventitious sounds. CARDIOVASCULAR: S1 and S2. Regular rate and rhythm.  No rubs, murmurs or gallops heard. ABDOMEN: Benign. MUSCULOSKELETAL: No joint deformity, early sclerodactyly, very mild clubbing. NEUROLOGIC: No overt focal deficit, no gait disturbance, speech is fluent. SKIN: Intact,warm,dry.  The previously noted flushed appearance to her face and neck has resolved.   PSYCH: Mood and behavior normal.  6 MWD: 78.5%, lower limits of expected for patient  Assessment & Plan:     ICD-10-CM   1. Systemic sclerosis with lung involvement (HCC)  M34.81     2. ILD (interstitial lung disease) (HCC)  J84.9     3. 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 Oxygen saturation improved from 98% to 99% during a six-minute walk test, indicating good lung function. No current symptoms of cough.  No evidence of pulmonary hypertension on echocardiogram from July last year. - Patient has pulmonary function test next week. - Defer echocardiogram until next year unless symptoms change. - Advise on altitude management: maintain hydration, pace activities, and use supplemental oxygen if needed. - Avoid Sildenafil due to potential hypotensive effects. - Follow up in six months unless needed sooner. - Call with results from the pulmonary function test.      Advised if symptoms do not improve or worsen, to please contact office for sooner follow up or seek emergency care.    I spent 35 minutes of dedicated to the care of this patient on the date of this encounter to include pre-visit review of records, face-to-face time with the patient discussing conditions above, post visit ordering of testing, clinical documentation with the electronic health record, making appropriate referrals as documented, and communicating necessary findings to members of the patients care team.     C. Leita Sanders, MD Advanced Bronchoscopy P[facility]    *This note was generated using voice recognition software/Dragon and/or AI transcription program.  Despite best efforts to proofread, errors can occur which can change the meaning. Any transcriptional errors that result from this process are unintentional and may not be fully corrected at the time of dictation.

## 2024-06-22 ENCOUNTER — Other Ambulatory Visit: Payer: Self-pay

## 2024-06-22 ENCOUNTER — Encounter: Payer: Self-pay | Admitting: Pulmonary Disease

## 2024-06-22 DIAGNOSIS — R0602 Shortness of breath: Secondary | ICD-10-CM

## 2024-06-23 ENCOUNTER — Ambulatory Visit: Admitting: Pulmonary Disease

## 2024-06-23 DIAGNOSIS — R053 Chronic cough: Secondary | ICD-10-CM

## 2024-06-23 DIAGNOSIS — J849 Interstitial pulmonary disease, unspecified: Secondary | ICD-10-CM | POA: Diagnosis not present

## 2024-06-23 DIAGNOSIS — R0602 Shortness of breath: Secondary | ICD-10-CM

## 2024-06-23 DIAGNOSIS — M3481 Systemic sclerosis with lung involvement: Secondary | ICD-10-CM

## 2024-06-23 DIAGNOSIS — J683 Other acute and subacute respiratory conditions due to chemicals, gases, fumes and vapors: Secondary | ICD-10-CM

## 2024-06-23 LAB — PULMONARY FUNCTION TEST
DL/VA % pred: 83 %
DL/VA: 3.69 ml/min/mmHg/L
DLCO unc % pred: 76 %
DLCO unc: 17.41 ml/min/mmHg
FEF 25-75 Post: 3.89 L/s
FEF 25-75 Pre: 3.5 L/s
FEF2575-%Change-Post: 11 %
FEF2575-%Pred-Post: 119 %
FEF2575-%Pred-Pre: 107 %
FEV1-%Change-Post: 0 %
FEV1-%Pred-Post: 99 %
FEV1-%Pred-Pre: 99 %
FEV1-Post: 3.15 L
FEV1-Pre: 3.14 L
FEV1FVC-%Change-Post: 2 %
FEV1FVC-%Pred-Pre: 108 %
FEV6-%Change-Post: -1 %
FEV6-%Pred-Post: 91 %
FEV6-%Pred-Pre: 92 %
FEV6-Post: 3.44 L
FEV6-Pre: 3.51 L
FEV6FVC-%Pred-Post: 101 %
FEV6FVC-%Pred-Pre: 101 %
FVC-%Change-Post: -1 %
FVC-%Pred-Post: 89 %
FVC-%Pred-Pre: 91 %
FVC-Post: 3.44 L
FVC-Pre: 3.51 L
Post FEV1/FVC ratio: 92 %
Post FEV6/FVC ratio: 100 %
Pre FEV1/FVC ratio: 90 %
Pre FEV6/FVC Ratio: 100 %
RV % pred: 119 %
RV: 1.91 L
TLC % pred: 88 %
TLC: 4.63 L

## 2024-06-23 NOTE — Progress Notes (Signed)
 Full PFT completed today ? ?

## 2024-06-23 NOTE — Patient Instructions (Signed)
 Full PFT completed today ? ?

## 2024-11-10 ENCOUNTER — Ambulatory Visit
Admission: RE | Admit: 2024-11-10 | Discharge: 2024-11-10 | Disposition: A | Discharge status: Home / Self Care | Attending: Pulmonary Disease | Admitting: Pulmonary Disease

## 2024-11-10 ENCOUNTER — Encounter: Payer: Self-pay | Admitting: Pulmonary Disease

## 2024-11-10 ENCOUNTER — Ambulatory Visit: Admitting: Pulmonary Disease

## 2024-11-10 ENCOUNTER — Ambulatory Visit
Admission: RE | Admit: 2024-11-10 | Discharge: 2024-11-10 | Disposition: A | Discharge status: Home / Self Care | Source: Ambulatory Visit | Attending: Pulmonary Disease | Admitting: Pulmonary Disease

## 2024-11-10 VITALS — BP 112/80 | HR 93 | Temp 97.6°F | Ht 65.0 in | Wt 145.0 lb

## 2024-11-10 DIAGNOSIS — M3481 Systemic sclerosis with lung involvement: Secondary | ICD-10-CM

## 2024-11-10 DIAGNOSIS — J849 Interstitial pulmonary disease, unspecified: Secondary | ICD-10-CM | POA: Diagnosis not present

## 2024-11-10 DIAGNOSIS — R509 Fever, unspecified: Secondary | ICD-10-CM | POA: Insufficient documentation

## 2024-11-10 DIAGNOSIS — M349 Systemic sclerosis, unspecified: Secondary | ICD-10-CM | POA: Diagnosis not present

## 2024-11-10 DIAGNOSIS — J209 Acute bronchitis, unspecified: Secondary | ICD-10-CM | POA: Diagnosis not present

## 2024-11-10 DIAGNOSIS — R051 Acute cough: Secondary | ICD-10-CM

## 2024-11-10 MED ORDER — PROMETHAZINE-DM 6.25-15 MG/5ML PO SYRP: 5.0000 mL | ORAL_SOLUTION | Freq: Four times a day (QID) | ORAL | 0 refills | Status: DC | PRN

## 2024-11-10 MED ORDER — DOXYCYCLINE HYCLATE 100 MG PO TABS: 100.0000 mg | ORAL_TABLET | Freq: Two times a day (BID) | ORAL | 0 refills | Status: AC

## 2024-11-10 NOTE — Patient Instructions (Signed)
 VISIT SUMMARY:  Today, you were seen for a fever and a productive cough with brownish sputum. You have a history of scleroderma and lung disease. Your symptoms started with chills, fever, and body aches, which improved, but then you developed a painful cough. A COVID test was negative, and it is suspected that you may have had a viral infection followed by a bacterial infection.  YOUR PLAN:  -ACUTE BRONCHITIS: Acute bronchitis is an inflammation of the bronchial tubes, usually caused by an infection. You have been prescribed doxycycline  to treat the bacterial infection. You should take this medication twice daily. A chest x-ray has been ordered to rule out pneumonia. You have also been prescribed cough syrup to help relieve your symptoms. Please avoid sun exposure while taking doxycycline , as it can make your skin more sensitive to sunlight. Additionally, use a mask and practice good hand hygiene to prevent spreading the infection to others.  -SYSTEMIC SCLEROSIS WITH INTERSTITIAL LUNG DISEASE: Systemic sclerosis is a chronic connective tissue disease that can affect the lungs, leading to interstitial lung disease. We will continue to monitor your symptoms closely.  INSTRUCTIONS:  Please follow up after completing the course of doxycycline  or sooner if your symptoms worsen. Ensure you get the chest x-ray done as ordered. Continue to use the cough syrup as needed for relief. Avoid sun exposure while taking doxycycline  and practice good hand hygiene and mask use to prevent spreading the infection.  We have set up follow-up appointment for 3 weeks.

## 2024-11-10 NOTE — Progress Notes (Addendum)
 "  Subjective:    Patient ID: [name], female    DOB: 20-Aug-1986, 39 y.o.   MRN: [id number]  Patient Care Team: Gretta Comer POUR, NP as PCP - General (Internal Medicine) Debrah Josette ORN., PA-C (Family Medicine) Dow Frederic BIRCH, MD as Referring Physician (Orthopedic Surgery) Stuart Norris, NP as Nurse Practitioner (Obstetrics and Gynecology) Tobie Lady POUR, MD as Consulting Physician (Rheumatology) Tamea Dedra CROME, MD as Consulting Physician (Pulmonary Disease)  Chief Complaint  Patient presents with   Acute Visit    Fever Friday night. Body ache and headache on Saturday. Dry cough since Sunday. This morning cough with brown phlegm. Patient did not check for flu or COVID.     BACKGROUND/INTERVAL: [name] is a 39 year old very complex lifelong never smoker with systemic sclerosis and lung involvement who presents for an ACUTE VISIT.  She was last seen on 15 June 2024.    HPI Discussed the use of AI scribe software for clinical note transcription with the patient, who gave verbal consent to proceed.  History of Present Illness   [name] is a 39 year old female with scleroderma and lung disease who presents with fever and cough producing greenish sputum.   She experienced chills, fever, and body aches starting on Friday night. By Saturday, the fever had resolved, but she remained achy and stayed in bed all day. On Sunday, she began to feel better but developed a dry cough, which has since progressed to a productive cough with brown-colored mucus.  The cough is painful (raw feeling) in the throat and anterior chest area, particularly when coughing, requiring her to hold herself in a certain way to manage the pain. No chest pain aside from the discomfort associated with coughing.  No hemoptysis.  She has a history of scleroderma and lung disease secondary to scleroderma.  Steroids are in general to be avoided due to potential triggering of renal crisis due to her  scleroderma.    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  No bronchodilator response.  Lung volumes mildly decreased TLC at 81%, RV at 79%, diffusion capacity mildly reduced at 67%.  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  No significant change from study performed 18 December 2021 05/29/2023 CT Chest high res: Independent review shows no significant change from prior, however still not read by radiologist at the time of this visit. 06/02/2023 PFTs: FEV1 2.43 L or 76% predicted, FVC 3.33 L or 86% predicted, lung volumes within normal limits.  Diffusion capacity mildly reduced at 68% no statistical change from prior. 09/21/2023 chest x-ray PA and lateral: Ill-defined opacities corresponding  to chronic groundglass densities on prior CT.  No acute changes (on independent review the patient had a small posterior infiltrate on the left). 06/15/2024 : Expect 6 MWD 650 m, actual 6  MWD for patient 510 m (lower limit of normal) distance walked percentage of expected 6 MWD 78.5%.  Review of Systems A 10 point review of systems was performed and it is as noted above otherwise negative.   Patient Active Problem List   Diagnosis Date Noted   GERD (gastroesophageal reflux disease) 03/01/2024   Rash and nonspecific skin eruption 03/01/2024   Left knee pain 01/20/2024   Encounter for annual general medical examination with abnormal findings in adult 09/18/2022   Systemic sclerosis with lung involvement (HCC) 01/30/2022   ILD (interstitial lung disease) (HCC) 01/30/2022   Chronic cough 10/18/2021   Chronic right-sided low back pain 06/25/2020   Seasonal allergies 02/02/2020   Migraines 06/27/2019   Abnormal Pap smear 09/06/2013    Social History   Tobacco Use   Smoking status: Never   Smokeless tobacco: Never  Substance Use Topics   Alcohol use: Not Currently    Comment: socially    Allergies[1]  Active Medications[2]  Immunization History  Administered Date(s) Administered   PFIZER(Purple Top)SARS-COV-2 Vaccination 12/05/2019, 01/02/2020, 01/27/2020, 02/21/2020, 10/10/2020   Tdap 08/22/2019      Objective:     Vitals:   11/10/24 1409  BP: 112/80  Pulse: 93  Temp: 97.6 F (36.4 C)  Height: 5' 5 (1.651 m)  Weight: 145 lb (65.8 kg)  SpO2: 98%  TempSrc: Temporal  BMI (Calculated): 24.13     SpO2: 98 %  GENERAL: Well-developed, well-nourished young woman, appears acutely ill but nontoxic, fully ambulatory.  No conversational dyspnea. No throat clearing, no cough noted.  HEAD: Normocephalic, atraumatic.  EYES: Pupils equal, round, reactive to light.  No scleral icterus.  MOUTH: Nose/mouth/throat not examined due to institutional masking requirements.   NECK: Supple. No thyromegaly. Trachea midline. No JVD.  No adenopathy. PULMONARY: Good air entry bilaterally.  Few rhonchi noted otherwise no adventitious sounds. CARDIOVASCULAR: S1 and S2.  Regular rate and rhythm.  No rubs, murmurs or gallops heard. ABDOMEN: Benign. MUSCULOSKELETAL: No joint deformity, early sclerodactyly, very mild clubbing. NEUROLOGIC: No overt focal deficit, no gait disturbance, speech is fluent. SKIN: Intact,warm,dry.  The previously noted flushed appearance to her face and neck has resolved.   PSYCH: Mood and behavior normal.    POC COVID 19: Negative  Chest x-ray was performed today, no acute pulmonary process, chronic markings as previous:       Assessment & Plan:     ICD-10-CM   1. Acute cough  R05.1 DG Chest 2 View    2. Febrile illness, acute  R50.9 DG Chest 2 View    3. ILD (interstitial lung disease) (HCC)  J84.9     4. Systemic sclerosis with lung involvement (HCC)  M34.81       Orders Placed This Encounter  Procedures   DG Chest 2 View    Standing Status:   Future    Expected Date:   11/10/2024    Expiration Date:   11/10/2025    Reason for Exam (SYMPTOM  OR DIAGNOSIS REQUIRED):   Acute cough, febrile illness    Preferred imaging location?:   [facility]    Meds ordered this encounter  Medications   doxycycline  (VIBRA -TABS) 100 MG tablet    Sig: Take 1 tablet (100 mg total) by mouth 2 (two) times daily for  7 days.    Dispense:  14 tablet    Refill:  0   promethazine -dextromethorphan (PROMETHAZINE -DM) 6.25-15 MG/5ML syrup    Sig: Take 5 mLs by mouth 4 (four) times daily as needed for cough.    Dispense:  180 mL    Refill:  0   Discussion:    Acute tracheo bronchitis Acute onset of cough with greenish sputum, initially dry and now productive. Negative COVID test. Possible initial viral infection (flu or parainfluenza) followed by bacterial superinfection. Painful cough with chest discomfort. Differential includes bacterial bronchitis secondary to viral infection. - Prescribed doxycycline  twice daily for bacterial coverage. - Ordered chest x-ray to rule out pneumonia: No acute infiltrate noted. - Prescribed cough  syrup for symptomatic relief. - Advised on sun avoidance due to doxycycline  side effects. - Recommended mask use and hand hygiene to prevent transmission.  Systemic sclerosis with interstitial lung disease Chronic condition with potential exacerbation due to current respiratory symptoms.     Advised if symptoms do not improve or worsen, to please contact office for sooner follow up or seek emergency care.    I spent 34 minutes of dedicated to the care of this patient on the date of this encounter to include pre-visit review of records, face-to-face time with the patient discussing conditions above, post visit ordering of testing, clinical documentation with the electronic health record, making appropriate referrals as documented, and communicating necessary findings to members of the patients care team.     C. Leita Sanders, MD Advanced Bronchoscopy PCCM [facility] [facility]    *This note was generated using voice recognition software/Dragon and/or AI transcription program.  Despite best efforts to proofread, errors can occur which can change the meaning. Any transcriptional errors that result from this process are unintentional and may not be fully corrected at the time of dictation.     [1]  Allergies Allergen Reactions   Tioconazole Swelling    Patient states she had vaginal swelling only; no edema elsewhere.    Amoxicillin-Pot Clavulanate    Escitalopram     made me angry  [2]  Current Meds  Medication Sig   Albuterol -Budesonide (AIRSUPRA ) 90-80 MCG/ACT AERO Inhale 2 puffs into the lungs every 4 (four) hours as needed.   doxycycline  (VIBRA -TABS) 100 MG tablet Take 1 tablet (100 mg total) by mouth 2 (two) times daily for 7 days.   hydroxychloroquine (PLAQUENIL) 200 MG tablet Take 200 mg by mouth daily.   mycophenolate (CELLCEPT) 500 MG tablet Take by mouth 2 (two) times daily.   naproxen  sodium (ANAPROX  DS) 550 MG tablet 2 times daily with meals as needed for  mild to moderate pain. (Patient taking differently: as needed. 2 times daily with meals as needed for mild to moderate pain.)   omeprazole  (PRILOSEC) 40 MG capsule TAKE 1 CAPSULE (40 MG TOTAL) BY MOUTH DAILY.   promethazine -dextromethorphan (PROMETHAZINE -DM) 6.25-15 MG/5ML syrup Take 5 mLs by mouth 4 (four) times daily as needed for cough.   SUMAtriptan  (IMITREX ) 50 MG tablet Take 1 tablet by mouth at migraine onset and may repeat in 2 hours if headache persists or recurs.   tiZANidine  (ZANAFLEX ) 4 MG tablet Take 1 tablet (4 mg total) by mouth daily as needed for muscle spasms.   VIENVA 0.1-20 MG-MCG tablet Take 1 tablet by mouth daily.   "

## 2024-11-11 ENCOUNTER — Encounter: Payer: Self-pay | Admitting: Pulmonary Disease

## 2024-11-11 LAB — POC COVID19 BINAXNOW: SARS Coronavirus 2 Ag: NEGATIVE

## 2024-11-29 ENCOUNTER — Ambulatory Visit: Admitting: Primary Care

## 2024-11-29 ENCOUNTER — Ambulatory Visit: Admitting: Physician Assistant

## 2024-11-29 ENCOUNTER — Encounter: Payer: Self-pay | Admitting: Pulmonary Disease

## 2024-11-29 ENCOUNTER — Ambulatory Visit: Admitting: Pulmonary Disease

## 2024-11-29 VITALS — BP 106/70 | HR 74 | Temp 98.1°F | Ht 65.0 in | Wt 144.0 lb

## 2024-11-29 VITALS — BP 102/74 | HR 77 | Temp 98.3°F | Ht 65.0 in | Wt 144.6 lb

## 2024-11-29 DIAGNOSIS — M3481 Systemic sclerosis with lung involvement: Secondary | ICD-10-CM

## 2024-11-29 DIAGNOSIS — M349 Systemic sclerosis, unspecified: Secondary | ICD-10-CM | POA: Diagnosis not present

## 2024-11-29 DIAGNOSIS — J849 Interstitial pulmonary disease, unspecified: Secondary | ICD-10-CM | POA: Diagnosis not present

## 2024-11-29 DIAGNOSIS — J683 Other acute and subacute respiratory conditions due to chemicals, gases, fumes and vapors: Secondary | ICD-10-CM

## 2024-11-29 DIAGNOSIS — J029 Acute pharyngitis, unspecified: Secondary | ICD-10-CM | POA: Diagnosis not present

## 2024-11-29 DIAGNOSIS — Z8709 Personal history of other diseases of the respiratory system: Secondary | ICD-10-CM

## 2024-11-29 LAB — POCT RAPID STREP A (OFFICE): Rapid Strep A Screen: NEGATIVE

## 2024-11-29 NOTE — Assessment & Plan Note (Addendum)
 Exam today questionable.   Rapid strep negative in the office today. Will add throat culture for which is pending.  Continue omeprazole  40 mg daily as prescribed. Consider addition of antihistamine.  Discussed Tylenol /Ibuprofen PRN. Await results.

## 2024-11-29 NOTE — Patient Instructions (Signed)
 VISIT SUMMARY:  During your visit, we discussed your recent issues with bronchitis and reviewed your ongoing management of scleroderma and interstitial lung disease. You have experienced significant improvement in your breathing and a reduction in your persistent cough. Your recent chest X-ray and CT scan were stable, and your current treatment with mycophenolate has been effective.  YOUR PLAN:  -SYSTEMIC SCLEROSIS WITH INTERSTITIAL LUNG DISEASE: Systemic sclerosis is a condition that causes hardening and tightening of the skin and connective tissues, and interstitial lung disease involves scarring of the lung tissue. Your condition is well-managed with mycophenolate, which has improved your lung condition. We will continue your current treatment regimen and schedule a follow-up after your next CT scan in July. We will also perform breathing tests during your follow-up visit.  -ACUTE BRONCHITIS: Acute bronchitis is an inflammation of the bronchial tubes in the lungs, usually caused by an infection. Your recent episode has improved significantly, though you still have a lingering cough. Your lungs sound normal, and your chest X-ray is unremarkable. We will continue to monitor your respiratory symptoms and manage them as needed.  INSTRUCTIONS:  Please continue your current treatment regimen with mycophenolate. We will schedule a follow-up visit after your next CT scan in July, during which we will also perform breathing tests. In the meantime, continue to monitor your respiratory symptoms and manage them as needed.

## 2024-11-29 NOTE — Progress Notes (Signed)
 "  Subjective:    Patient ID: [name], female    DOB: [birth year], 39 y.o.   MRN: [id number]  Patient Care Team: Gretta Comer POUR, NP as PCP - General (Internal Medicine) Debrah Josette ORN., PA-C (Family Medicine) Dow Frederic BIRCH, MD as Referring Physician (Orthopedic Surgery) Stuart Norris, NP as Nurse Practitioner (Obstetrics and Gynecology) Tobie Lady POUR, MD as Consulting Physician (Rheumatology) Tamea Dedra CROME, MD as Consulting Physician (Pulmonary Disease)  Chief Complaint  Patient presents with   Medical Management of Chronic Issues    BACKGROUND/INTERVAL: [name] is a 39 year old very complex lifelong never smoker with systemic sclerosis and lung involvement who presents for follow-up after an acute visit on 10 November 2024.   HPI Discussed the use of AI scribe software for clinical note transcription with the patient, who gave verbal consent to proceed.  History of Present Illness   [name] is a 39 year old female with scleroderma and interstitial lung disease who presents with recent issues of bronchitis.  She has experienced significant improvement in her breathing since the last visit. Her previously persistent cough has subsided but still lingers occasionally as a 'nagging cough'.  It is however improving.  She has been on immunosuppressants (mycophenolate) for nearly three years, which has led to more frequent illnesses, often involving congestion and chest-related symptoms. Despite this, she feels her condition has improved with mycophenolate, as her symptoms are less severe than before.  Her last chest X-ray performed her last visit and CT chest scan in July were reviewed during the visit. Previously, she experienced a persistent cough for a year and a half, which has since improved.     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  No bronchodilator response.  Lung volumes mildly decreased TLC at 81%, RV at 79%, diffusion capacity mildly reduced at 67%.  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  No significant change from study performed 18 December 2021 05/29/2023 CT Chest high res: Independent review shows no significant change from prior, however still not read by radiologist at the time of this visit. 06/02/2023 PFTs: FEV1 2.43 L or 76% predicted, FVC 3.33 L or 86% predicted, lung volumes within normal limits.  Diffusion capacity mildly reduced at 68% no statistical change from prior. 09/21/2023 chest x-ray PA and lateral: Ill-defined opacities corresponding to chronic groundglass densities on prior CT.  No acute changes (on independent review the patient had a small posterior infiltrate on the left). 06/15/2024 : Expect 6 MWD 650 m, actual 6 MWD for patient 510 m (lower limit of normal) distance walked percentage of expected 6 MWD  78.5%.   Review of Systems A 10 point review of systems was performed and it is as  noted above otherwise negative.   Patient Active Problem List   Diagnosis Date Noted   GERD (gastroesophageal reflux disease) 03/01/2024   Rash and nonspecific skin eruption 03/01/2024   Left knee pain 01/20/2024   Encounter for annual general medical examination with abnormal findings in adult 09/18/2022   Systemic sclerosis with lung involvement (HCC) 01/30/2022   ILD (interstitial lung disease) (HCC) 01/30/2022   Chronic cough 10/18/2021   Chronic right-sided low back pain 06/25/2020   Seasonal allergies 02/02/2020   Migraines 06/27/2019   Abnormal Pap smear 09/06/2013    Social History   Tobacco Use   Smoking status: Never   Smokeless tobacco: Never  Substance Use Topics   Alcohol use: Not Currently    Comment: socially    Allergies[1]  Active Medications[2]  Immunization History  Administered Date(s) Administered   PFIZER(Purple Top)SARS-COV-2 Vaccination 12/05/2019, 01/02/2020, 01/27/2020, 02/21/2020, 10/10/2020   Tdap 08/22/2019        Objective:     Vitals:   11/29/24 1312  BP: 106/70  Pulse: 74  Temp: 98.1 F (36.7 C)  Height: 5' 5 (1.651 m)  Weight: 144 lb (65.3 kg)  SpO2: 100%  TempSrc: Temporal  BMI (Calculated): 23.96     SpO2: 100 %  GENERAL: Well-developed, well-nourished young woman, well appearing, fully ambulatory.  No conversational dyspnea. No throat clearing, no cough noted.  HEAD: Normocephalic, atraumatic.  EYES: Pupils equal, round, reactive to light.  No scleral icterus.  MOUTH: Nose/mouth/throat not examined due to institutional masking requirements.   NECK: Supple. No thyromegaly. Trachea midline. No JVD.  No adenopathy. PULMONARY: Good air entry bilaterally.  Clear to auscultation bilaterally. CARDIOVASCULAR: S1 and S2. Regular rate and rhythm.  No rubs, murmurs or gallops heard. ABDOMEN: Benign. MUSCULOSKELETAL: No joint deformity, early sclerodactyly, very mild clubbing. NEUROLOGIC: No overt focal deficit, no gait  disturbance, speech is fluent. SKIN: Intact,warm,dry.  The previously noted flushed appearance to her face and neck has resolved.   PSYCH: Mood and behavior normal.        Assessment & Plan:     ICD-10-CM   1. Systemic sclerosis with lung involvement (HCC)  M34.81 CT Chest High Resolution    Pulmonary function test    2. ILD (interstitial lung disease) (HCC)  J84.9 CT Chest High Resolution    Pulmonary function test    3. 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       Orders Placed This Encounter  Procedures   CT Chest High Resolution    Standing Status:   Future    Expected Date:   05/29/2025    Expiration Date:   11/29/2025    Is patient pregnant?:   No    Preferred imaging location?:   [facility]   Pulmonary function test    Standing Status:   Future    Expected Date:   05/29/2025    Expiration Date:   11/29/2025    Where should this test be performed?:   Outpatient Pulmonary    What type of PFT is being ordered?:   Full PFT   Discussion:    Systemic sclerosis with interstitial lung disease Well-managed with no significant changes on the last CT scan in July. Mycophenolate has improved lung condition, reducing density and patchiness. New medication for fibrosis with a better side effect profile is available if needed. - Continue current treatment regimen  with mycophenolate. - She has opted to hold off of antifibrotics for now. - Will schedule follow-up after the next CT scan in July. - Will perform breathing tests during the follow-up visit.  Acute bronchitis, resolved Recent episode has improved significantly with a lingering cough which continues to improve. Lungs sound normal on examination, and chest X-ray is unremarkable. Increased susceptibility to respiratory infections likely due to immunosuppressant therapy. - Continue to monitor respiratory symptoms and manage as needed.    Follow-up in 6 months with high-resolution chest CT and  PFTs.  Advised if symptoms do not improve or worsen, to please contact office for sooner follow up or seek emergency care.    I spent 30 minutes of dedicated to the care of this patient on the date of this encounter to include pre-visit review of records, face-to-face time with the patient discussing conditions above, post visit ordering of testing, clinical documentation with the electronic health record, making appropriate referrals as documented, and communicating necessary findings to members of the patients care team.     C. Leita Sanders, MD Advanced Bronchoscopy PCCM [facility]    *This note was generated using voice recognition software/Dragon and/or AI transcription program.  Despite best efforts to proofread, errors can occur which can change the meaning. Any transcriptional errors that result from this process are unintentional and may not be fully corrected at the time of dictation.    [1]  Allergies Allergen Reactions   Tioconazole Swelling    Patient states she had vaginal swelling only; no edema elsewhere.    Amoxicillin-Pot Clavulanate    Escitalopram     made me angry  [2]  Current Meds  Medication Sig   Albuterol -Budesonide (AIRSUPRA ) 90-80 MCG/ACT AERO Inhale 2 puffs into the lungs every 4 (four) hours as needed.   hydroxychloroquine (PLAQUENIL) 200 MG tablet Take 200 mg by mouth daily.   mycophenolate (CELLCEPT) 500 MG tablet Take by mouth 2 (two) times daily.   naproxen  sodium (ANAPROX  DS) 550 MG tablet 2 times daily with meals as needed for mild to moderate pain. (Patient taking differently: as needed. 2 times daily with meals as needed for mild to moderate pain.)   omeprazole  (PRILOSEC) 40 MG capsule TAKE 1 CAPSULE (40 MG TOTAL) BY MOUTH DAILY.   SUMAtriptan  (IMITREX ) 50 MG tablet Take 1 tablet by mouth at migraine onset and may repeat in 2 hours if headache persists or recurs.   tiZANidine  (ZANAFLEX ) 4 MG tablet Take 1 tablet (4 mg  total) by mouth daily as needed for muscle spasms.   VIENVA 0.1-20 MG-MCG tablet Take 1 tablet by mouth daily.   "

## 2024-11-29 NOTE — Progress Notes (Signed)
 "  Subjective:    Patient ID: [name], female    DOB: [birth year], 39 y.o.   MRN: [id number]  Alina Gilkey is a very pleasant 39 y.o. female with a historyof migraines, systemic sclerosis with lung involvement, GERD, seasonal allergies, chronic cough who presents today to discuss sore throat.   Symptom onset four days ago with sore throat. She's since noticed difficulty and painful swallowing. She's also noticed an enlarged lymph node to the right side of her neck. She was sick a few weeks ago, was presumed to be influenza, not tested. These symptoms have resolved.   She denies cough, fevers, chills, congestion, headaches. She does notice post nasal drip in the morning. Her throat is more painful at night. She is compliant to omeprazole  40 mg daily which controls her reflux symptoms.    Evaluated by pulmonology this morning for follow up of systemic sclerosis. She is managed on Cellcept and Plaquenil daily. During this visit no changes were made as symptoms have significantly improved.     Review of Systems  Constitutional:  Negative for chills and fever.  HENT:  Positive for postnasal drip and sore throat. Negative for congestion and ear pain.   Respiratory:  Negative for cough.   Neurological:  Negative for headaches.         Past Medical History:  Diagnosis Date   Abdominal pain    Arthritis    Asthma    Chickenpox    Diarrhea    Fatigue    GERD (gastroesophageal reflux disease)    Migraines    Nausea    Poor appetite    Symptomatic cholelithiasis 06/24/2011   Wears glasses    Weight loss     Social History   Socioeconomic History   Marital status: Married    Spouse name: Not on file   Number of children: Not on file   Years of education: Not on file   Highest education level: Some college, no degree  Occupational History   Not on file  Tobacco Use   Smoking status: Never   Smokeless tobacco: Never  Substance and Sexual Activity   Alcohol use: Not  Currently    Comment: socially   Drug use: Never   Sexual activity: Yes    Birth control/protection: Pill  Other Topics Concern   Not on file  Social History Narrative   Married.   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       Social Drivers of Health   Tobacco Use: Low Risk (11/29/2024)   Patient History    Smoking Tobacco Use: Never    Smokeless Tobacco Use: Never    Passive Exposure: Not on file  Financial Resource Strain: Low Risk (11/28/2024)   Overall Financial Resource Strain (CARDIA)    Difficulty of Paying Living Expenses: Not hard at all  Food Insecurity: No Food Insecurity (11/28/2024)   Epic    Worried About Radiation Protection Practitioner of Food in the Last Year: Never true    Ran Out of Food in the Last Year: Never true  Transportation Needs: No Transportation Needs (11/28/2024)   Epic    Lack of Transportation (Medical): No    Lack of Transportation (Non-Medical): No  Physical Activity: Sufficiently Active (11/28/2024)   Exercise Vital Sign    Days of Exercise per Week: 7 days    Minutes of Exercise per Session: 50 min  Stress: No Stress Concern Present (11/28/2024)   [employer] of Occupational Health - Occupational  Stress Questionnaire    Feeling of Stress: Not at all  Social Connections: Moderately Isolated (11/28/2024)   Social Connection and Isolation Panel    Frequency of Communication with Friends and Family: Twice a week    Frequency of Social Gatherings with Friends and Family: More than three times a week    Attends Religious Services: Patient declined    Active Member of Clubs or Organizations: No    Attends Engineer, Structural: Not on file    Marital Status: Married  Catering Manager Violence: Not on file  Depression (PHQ2-9): Low Risk (11/29/2024)   Depression (PHQ2-9)    PHQ-2 Score: 0  Alcohol Screen: Low Risk (11/28/2024)   Alcohol Screen    Last Alcohol Screening Score (AUDIT): 1  Housing: Low Risk (11/28/2024)   Epic    Unable to Pay for Housing  in the Last Year: No    Number of Times Moved in the Last Year: 0    Homeless in the Last Year: No  Utilities: Not on file  Health Literacy: Not on file    Past Surgical History:  Procedure Laterality Date   CHOLECYSTECTOMY     LAPAROSCOPIC CHOLECYSTECTOMY  06/28/11   WISDOM TOOTH EXTRACTION      Family History  Problem Relation Age of Onset   Cervical cancer Mother    Multiple sclerosis Mother    ADD / ADHD Father    Arthritis Maternal Grandmother    Hyperlipidemia Maternal Grandmother    Stomach cancer Maternal Grandfather    Cancer Maternal Grandfather    Lung cancer Paternal Grandfather    Cancer Paternal Grandfather    COPD Paternal Grandmother    Diabetes Paternal Grandmother    Heart disease Paternal Grandmother     Allergies[1]  Medications Ordered Prior to Encounter[2]  BP 102/74   Pulse 77   Temp 98.3 F (36.8 C) (Oral)   Ht 5' 5 (1.651 m)   Wt 144 lb 9.6 oz (65.6 kg)   LMP 11/09/2024 (Exact Date)   SpO2 100%   BMI 24.06 kg/m  Objective:   Physical Exam Constitutional:      Appearance: She is not ill-appearing.  HENT:     Mouth/Throat:     Pharynx: Pharyngeal swelling and posterior oropharyngeal erythema present. No oropharyngeal exudate.  Cardiovascular:     Rate and Rhythm: Normal rate and regular rhythm.  Pulmonary:     Effort: Pulmonary effort is normal.     Breath sounds: Normal breath sounds. No wheezing or rhonchi.  Skin:    General: Skin is warm and dry.  Neurological:     Mental Status: She is alert.     Physical Exam        Assessment & Plan:  Sore throat Assessment & Plan: Exam today questionable.   Rapid strep negative in the office today. Will add throat culture for which is pending.  Continue omeprazole  40 mg daily as prescribed. Consider addition of antihistamine.  Discussed Tylenol /Ibuprofen PRN. Await results.   Orders: -     POCT rapid strep A -     Culture, Group A Strep    Assessment and  Plan Assessment & Plan         Comer MARLA Gaskins, NP       [1]  Allergies Allergen Reactions   Tioconazole Swelling    Patient states she had vaginal swelling only; no edema elsewhere.    Amoxicillin-Pot Clavulanate    Escitalopram     made  me angry  [2]  Current Outpatient Medications on File Prior to Visit  Medication Sig Dispense Refill   Albuterol -Budesonide (AIRSUPRA ) 90-80 MCG/ACT AERO Inhale 2 puffs into the lungs every 4 (four) hours as needed.     hydroxychloroquine (PLAQUENIL) 200 MG tablet Take 200 mg by mouth daily.     mycophenolate (CELLCEPT) 500 MG tablet Take by mouth 2 (two) times daily.     naproxen  sodium (ANAPROX  DS) 550 MG tablet 2 times daily with meals as needed for mild to moderate pain. (Patient taking differently: as needed. 2 times daily with meals as needed for mild to moderate pain.) 20 tablet 1   omeprazole  (PRILOSEC) 40 MG capsule TAKE 1 CAPSULE (40 MG TOTAL) BY MOUTH DAILY. 90 capsule 2   SUMAtriptan  (IMITREX ) 50 MG tablet Take 1 tablet by mouth at migraine onset and may repeat in 2 hours if headache persists or recurs. 10 tablet 0   tiZANidine  (ZANAFLEX ) 4 MG tablet Take 1 tablet (4 mg total) by mouth daily as needed for muscle spasms. 30 tablet 0   VIENVA 0.1-20 MG-MCG tablet Take 1 tablet by mouth daily.     No current facility-administered medications on file prior to visit.   "

## 2024-11-29 NOTE — Patient Instructions (Signed)
 Start ibuprofen 400 to 600 mg every 8 hours as needed for throat pain and swelling.   We will be in touch once we receive your throat swab.  It was a pleasure to see you today!

## 2024-12-01 ENCOUNTER — Ambulatory Visit: Payer: Self-pay | Admitting: Primary Care

## 2024-12-01 LAB — CULTURE, GROUP A STREP
Micro Number: 17516600
SPECIMEN QUALITY:: ADEQUATE

## 2024-12-06 ENCOUNTER — Encounter: Payer: Self-pay | Admitting: Physician Assistant

## 2024-12-06 ENCOUNTER — Ambulatory Visit: Admitting: Physician Assistant

## 2024-12-06 VITALS — BP 128/84

## 2024-12-06 DIAGNOSIS — M3481 Systemic sclerosis with lung involvement: Secondary | ICD-10-CM

## 2024-12-06 DIAGNOSIS — Z1283 Encounter for screening for malignant neoplasm of skin: Secondary | ICD-10-CM | POA: Diagnosis not present

## 2024-12-06 DIAGNOSIS — I781 Nevus, non-neoplastic: Secondary | ICD-10-CM | POA: Diagnosis not present

## 2024-12-06 DIAGNOSIS — L219 Seborrheic dermatitis, unspecified: Secondary | ICD-10-CM

## 2024-12-06 DIAGNOSIS — L309 Dermatitis, unspecified: Secondary | ICD-10-CM

## 2024-12-06 DIAGNOSIS — D1801 Hemangioma of skin and subcutaneous tissue: Secondary | ICD-10-CM | POA: Diagnosis not present

## 2024-12-06 DIAGNOSIS — W908XXA Exposure to other nonionizing radiation, initial encounter: Secondary | ICD-10-CM | POA: Diagnosis not present

## 2024-12-06 DIAGNOSIS — L814 Other melanin hyperpigmentation: Secondary | ICD-10-CM

## 2024-12-06 DIAGNOSIS — L578 Other skin changes due to chronic exposure to nonionizing radiation: Secondary | ICD-10-CM

## 2024-12-06 DIAGNOSIS — D229 Melanocytic nevi, unspecified: Secondary | ICD-10-CM

## 2024-12-06 DIAGNOSIS — I7300 Raynaud's syndrome without gangrene: Secondary | ICD-10-CM

## 2024-12-06 MED ORDER — ELIDEL 1 % EX CREA: TOPICAL_CREAM | CUTANEOUS | 0 refills | Status: AC

## 2024-12-06 MED ORDER — CLOBETASOL PROPIONATE 0.05 % EX CREA: 1.0000 | TOPICAL_CREAM | Freq: Two times a day (BID) | CUTANEOUS | 2 refills | Status: AC

## 2024-12-06 MED ORDER — CLOBETASOL PROPIONATE 0.05 % EX CREA: 1.0000 | TOPICAL_CREAM | Freq: Two times a day (BID) | CUTANEOUS | 1 refills | Status: DC

## 2024-12-06 NOTE — Patient Instructions (Signed)

## 2024-12-15 ENCOUNTER — Ambulatory Visit: Admitting: Physician Assistant

## 2025-01-16 ENCOUNTER — Ambulatory Visit: Admitting: Pulmonary Disease

## 2025-12-11 ENCOUNTER — Ambulatory Visit: Admitting: Physician Assistant
